# Patient Record
Sex: Female | Born: 1990 | Race: Black or African American | Hispanic: Yes | Marital: Single | Smoking: Former smoker
Health system: Southern US, Community
[De-identification: ages and names within clinical notes are randomized; demographics above are authoritative.]

## PROBLEM LIST (undated history)

## (undated) ENCOUNTER — Inpatient Hospital Stay (HOSPITAL_COMMUNITY): Payer: Self-pay

## (undated) DIAGNOSIS — D573 Sickle-cell trait: Secondary | ICD-10-CM

## (undated) DIAGNOSIS — K219 Gastro-esophageal reflux disease without esophagitis: Secondary | ICD-10-CM

## (undated) DIAGNOSIS — O24419 Gestational diabetes mellitus in pregnancy, unspecified control: Secondary | ICD-10-CM

## (undated) DIAGNOSIS — D649 Anemia, unspecified: Secondary | ICD-10-CM

## (undated) HISTORY — DX: Gestational diabetes mellitus in pregnancy, unspecified control: O24.419

## (undated) HISTORY — PX: NO PAST SURGERIES: SHX2092

---

## 2014-07-22 ENCOUNTER — Observation Stay: Payer: Self-pay | Admitting: Obstetrics and Gynecology

## 2015-02-21 NOTE — H&P (Signed)
L&D Evaluation:  History:  HPI 24 yo [id number] [facility]84[facility]d [facility] presenting with braxton hick contractions.  She reports being in court today for a speeding ticket when she started developing what she thought were braxton hicks contractions and was taken via EMS [facility].  She does have a history of a later preterm delivery with her most recent delivery 11/11/13 at 3356w5d.  Symptoms subsided shortly after arrival.  No LOF, no VB, +FM.  [facility], release of records signed.  [facility] and reports not being on 17-P injections   Presents with contractions   Patient's Medical History No Chronic Illness   Patient's Surgical History none   Medications Pre Natal Vitamins   Allergies latex   Social History none   Family History Non-Contributory   ROS:  ROS All systems were reviewed.  HEENT, CNS, GI, GU, Respiratory, CV, Renal and Musculoskeletal systems were found to be normal.   Exam:  Vital Signs stable   Urine Protein not completed   General no apparent distress   Mental Status clear   Abdomen gravid, non-tender   Estimated Fetal Weight Average for gestational age   Edema no edema   Pelvic no external lesions, cervix closed and thick   Mebranes Intact   FHT normal rate with no decels, 135, mod, +acc, no decels   Ucx absent   Skin dry, no lesions   Impression:  Impression 3584w4d discomforts of pregnancy   Plan:  Comments 1) Braxton Hicks contractions - vs possible discomforts of pregnancy, no contractions noted on tocometer  2) Fetus - category I, reactive tacing  3) Disp0 - 10/20 follow with primary OB/GYN   Electronic Signatures: Lorrene ReidStaebler, Olie Dibert M (MD)  (Signed 09-Oct-15 12:38)  Authored: L&D Evaluation   Last Updated: 09-Oct-15 12:38 by Lorrene ReidStaebler, Kaven Cumbie M (MD)

## 2015-04-30 ENCOUNTER — Emergency Department (INDEPENDENT_AMBULATORY_CARE_PROVIDER_SITE_OTHER)
Admission: EM | Admit: 2015-04-30 | Discharge: 2015-04-30 | Disposition: A | Discharge status: Home / Self Care | Payer: Self-pay | Source: Home / Self Care | Attending: Family Medicine | Admitting: Family Medicine

## 2015-04-30 ENCOUNTER — Encounter (HOSPITAL_COMMUNITY): Payer: Self-pay | Admitting: Emergency Medicine

## 2015-04-30 DIAGNOSIS — J029 Acute pharyngitis, unspecified: Secondary | ICD-10-CM

## 2015-04-30 LAB — POCT RAPID STREP A: Streptococcus, Group A Screen (Direct): NEGATIVE

## 2015-04-30 MED ORDER — AMOXICILLIN 500 MG PO CAPS: 500.0000 mg | ORAL_CAPSULE | Freq: Three times a day (TID) | ORAL | Status: DC

## 2015-04-30 NOTE — ED Provider Notes (Signed)
CSN: [id number]     Arrival date & time 04/30/15  1613 History   First MD Initiated Contact with Patient 04/30/15 1643     Chief Complaint  Patient presents with  . Sore Throat   (Consider location/radiation/quality/duration/timing/severity/associated sxs/prior Treatment) Patient is a 24 y.o. female presenting with pharyngitis. The history is provided by the patient.  Sore Throat This is a new problem. The current episode started 2 days ago. The problem has been gradually worsening. Pertinent negatives include no chest pain and no abdominal pain. The symptoms are aggravated by swallowing.    History reviewed. No pertinent past medical history. History reviewed. No pertinent past surgical history. No family history on file. History  Substance Use Topics  . Smoking status: Current Every Day Smoker  . Smokeless tobacco: Not on file  . Alcohol Use: No   OB History    No data available     Review of Systems  Constitutional: Positive for fever and chills. Negative for activity change and appetite change.  HENT: Positive for sore throat.   Respiratory: Negative.  Negative for cough.   Cardiovascular: Negative for chest pain.  Gastrointestinal: Negative for abdominal pain.    Allergies  Review of patient's allergies indicates no known allergies.  Home Medications   Prior to Admission medications   Medication Sig Start Date End Date Taking? Authorizing Provider  acetaminophen (TYLENOL) 325 MG tablet Take 650 mg by mouth every 6 (six) hours as needed.   Yes Historical Provider, MD  amoxicillin (AMOXIL) 500 MG capsule Take 1 capsule (500 mg total) by mouth 3 (three) times daily. 04/30/15   Linna HoffJames D Youlanda Tomassetti, MD   BP 133/72 mmHg  Pulse 61  Temp(Src) 100.3 F (37.9 C) (Oral)  Resp 18  SpO2 100%  LMP 03/16/2015 Physical Exam  Constitutional: She is oriented to person, place, and time. She appears well-developed and well-nourished.  HENT:  Head: Normocephalic.  Right Ear: External  ear normal.  Left Ear: External ear normal.  Mouth/Throat: Uvula is midline and mucous membranes are normal. Posterior oropharyngeal erythema present. No oropharyngeal exudate or tonsillar abscesses.  Eyes: Conjunctivae are normal. Pupils are equal, round, and reactive to light.  Cardiovascular: Regular rhythm and normal heart sounds.   Pulmonary/Chest: Effort normal and breath sounds normal.  Lymphadenopathy:    She has cervical adenopathy.  Neurological: She is alert and oriented to person, place, and time.  Skin: Skin is warm and dry.  Nursing note and vitals reviewed.   ED Course  Procedures (including critical care time) Labs Review Labs Reviewed  POCT RAPID STREP A   Strep neg. Imaging Review No results found.   MDM   1. Acute pharyngitis, unspecified pharyngitis type        Linna HoffJames D Sequan Auxier, MD 04/30/15 1723

## 2015-04-30 NOTE — ED Notes (Signed)
Reports being seen a month ago and diagnosed with pharyngitis.  Prescribed amoxicillin, but did not get prescription filled.  Symptoms resolved.  Now has sore throat and fever for 2 days

## 2015-04-30 NOTE — Discharge Instructions (Signed)
Drink lots of fluids, take all of medicine, use lozenges as needed.return if needed °

## 2015-05-02 LAB — CULTURE, GROUP A STREP: Strep A Culture: POSITIVE — AB

## 2015-05-02 NOTE — ED Notes (Signed)
Final repot positive for strep. Treatment adequate w Rx provided

## 2015-06-12 ENCOUNTER — Encounter (HOSPITAL_COMMUNITY): Payer: Self-pay | Admitting: *Deleted

## 2015-06-12 ENCOUNTER — Emergency Department (HOSPITAL_COMMUNITY)
Admission: EM | Admit: 2015-06-12 | Discharge: 2015-06-12 | Disposition: A | Discharge status: Home / Self Care | Payer: Medicaid Other | Attending: Emergency Medicine | Admitting: Emergency Medicine

## 2015-06-12 DIAGNOSIS — O209 Hemorrhage in early pregnancy, unspecified: Secondary | ICD-10-CM | POA: Diagnosis present

## 2015-06-12 DIAGNOSIS — Z3A09 9 weeks gestation of pregnancy: Secondary | ICD-10-CM | POA: Insufficient documentation

## 2015-06-12 DIAGNOSIS — Z792 Long term (current) use of antibiotics: Secondary | ICD-10-CM | POA: Diagnosis not present

## 2015-06-12 DIAGNOSIS — F17210 Nicotine dependence, cigarettes, uncomplicated: Secondary | ICD-10-CM | POA: Diagnosis not present

## 2015-06-12 DIAGNOSIS — O99331 Smoking (tobacco) complicating pregnancy, first trimester: Secondary | ICD-10-CM | POA: Insufficient documentation

## 2015-06-12 DIAGNOSIS — O034 Incomplete spontaneous abortion without complication: Secondary | ICD-10-CM | POA: Diagnosis not present

## 2015-06-12 LAB — BASIC METABOLIC PANEL
Anion gap: 4 — ABNORMAL LOW (ref 5–15)
BUN: 7 mg/dL (ref 6–20)
CO2: 22 mmol/L (ref 22–32)
Calcium: 8.9 mg/dL (ref 8.9–10.3)
Chloride: 107 mmol/L (ref 101–111)
Creatinine, Ser: 0.59 mg/dL (ref 0.44–1.00)
GFR calc Af Amer: >60 mL/min (ref >60)
GFR calc non Af Amer: >60 mL/min (ref >60)
GLUCOSE: 88 mg/dL (ref 65–99)
POTASSIUM: 3.7 mmol/L (ref 3.5–5.1)
Sodium: 133 mmol/L — ABNORMAL LOW (ref 135–145)

## 2015-06-12 LAB — CBC WITH DIFFERENTIAL/PLATELET
Basophils Absolute: 0 10*3/uL (ref 0.0–0.1)
Basophils Relative: 0 % (ref 0–1)
EOS PCT: 1 % (ref 0–5)
Eosinophils Absolute: 0.1 10*3/uL (ref 0.0–0.7)
HCT: 33.1 % — ABNORMAL LOW (ref 36.0–46.0)
Hemoglobin: 11.1 g/dL — ABNORMAL LOW (ref 12.0–15.0)
LYMPHS ABS: 3.5 10*3/uL (ref 0.7–4.0)
Lymphocytes Relative: 36 % (ref 12–46)
MCH: 26.9 pg (ref 26.0–34.0)
MCHC: 33.5 g/dL (ref 30.0–36.0)
MCV: 80.1 fL (ref 78.0–100.0)
Monocytes Absolute: 1.3 10*3/uL — ABNORMAL HIGH (ref 0.1–1.0)
Monocytes Relative: 13 % — ABNORMAL HIGH (ref 3–12)
Neutro Abs: 4.9 10*3/uL (ref 1.7–7.7)
Neutrophils Relative %: 50 % (ref 43–77)
PLATELETS: 265 10*3/uL (ref 150–400)
RBC: 4.13 MIL/uL (ref 3.87–5.11)
RDW: 16.9 % — ABNORMAL HIGH (ref 11.5–15.5)
WBC: 9.8 10*3/uL (ref 4.0–10.5)

## 2015-06-12 LAB — WET PREP, GENITAL
Trich, Wet Prep: NONE SEEN
YEAST WET PREP: NONE SEEN

## 2015-06-12 LAB — GC/CHLAMYDIA PROBE AMP (~~LOC~~) NOT AT ARMC
Chlamydia: NEGATIVE
Neisseria Gonorrhea: NEGATIVE

## 2015-06-12 LAB — HCG, QUANTITATIVE, PREGNANCY: hCG, Beta Chain, Quant, S: 81378 m[IU]/mL — ABNORMAL HIGH (ref <5)

## 2015-06-12 MED ORDER — ONDANSETRON 4 MG PO TBDP
4.0000 mg | ORAL_TABLET | Freq: Once | ORAL | Status: AC
  Administered 2015-06-12: 4 mg via ORAL
  Filled 2015-06-12: qty 1

## 2015-06-12 NOTE — ED Notes (Signed)
Pt comes from home with c/o vaginal bleeding, sts woke up in a puddle of blood, pt reports she is [redacted] weeks pregnant.

## 2015-06-12 NOTE — Discharge Instructions (Signed)
Incomplete Miscarriage Patty Espinoza, your exam shows an open cervix and likely miscarriage.  You did not want to stay for a formal ultrasound so you can go to your 8am appointment.  Please attend this appointment and see an OB/GYN within 3 days.  If any symptoms worsen, come back to the ED immediately.  Thank you. A miscarriage is the sudden loss of an unborn baby (fetus) before the 20th week of pregnancy. In an incomplete miscarriage, parts of the fetus or placenta (afterbirth) remain in the body.  Having a miscarriage can be an emotional experience. Talk with your health care provider about any questions you may have about miscarrying, the grieving process, and your future pregnancy plans. CAUSES   Problems with the fetal chromosomes that make it impossible for the baby to develop normally. Problems with the baby's genes or chromosomes are most often the result of errors that occur by chance as the embryo divides and grows. The problems are not inherited from the parents.  Infection of the cervix or uterus.  Hormone problems.  Problems with the cervix, such as having an incompetent cervix. This is when the tissue in the cervix is not strong enough to hold the pregnancy.  Problems with the uterus, such as an abnormally shaped uterus, uterine fibroids, or congenital abnormalities.  Certain medical conditions.  Smoking, drinking alcohol, or taking illegal drugs.  Trauma. SYMPTOMS   Vaginal bleeding or spotting, with or without cramps or pain.  Pain or cramping in the abdomen or lower back.  Passing fluid, tissue, or blood clots from the vagina. DIAGNOSIS  Your health care provider will perform a physical exam. You may also have an ultrasound to confirm the miscarriage. Blood or urine tests may also be ordered. TREATMENT   Usually, a dilation and curettage (D&C) procedure is performed. During a D&C procedure, the cervix is widened (dilated) and any remaining fetal or placental tissue is gently  removed from the uterus.  Antibiotic medicines are prescribed if there is an infection. Other medicines may be given to reduce the size of the uterus (contract) if there is a lot of bleeding.  If you have Rh negative blood and your baby was Rh positive, you will need a Rho (D) immune globulin shot. This shot will protect any future baby from having Rh blood problems in future pregnancies.  You may be confined to bed rest. This means you should stay in bed and only get up to use the bathroom. HOME CARE INSTRUCTIONS   Rest as directed by your health care provider.  Restrict activity as directed by your health care provider. You may be allowed to continue light activity if curettage was not done but you require further treatment.  Keep track of the number of pads you use each day. Keep track of how soaked (saturated) they are. Record this information.  Do not  use tampons.  Do not douche or have sexual intercourse until approved by your health care provider.  Keep all follow-up appointments for reevaluation and continuing management.  Only take over-the-counter or prescription medicines for pain, fever, or discomfort as directed by your health care provider.  Take antibiotic medicine as directed by your health care provider. Make sure you finish it even if you start to feel better. SEEK IMMEDIATE MEDICAL CARE IF:   You experience severe cramps in your stomach, back, or abdomen.  You have an unexplained temperature (make sure to record these temperatures).  You pass large clots or tissue (save these for  your health care provider to inspect).  Your bleeding increases.  You become light-headed, weak, or have fainting episodes. MAKE SURE YOU:   Understand these instructions.  Will watch your condition.  Will get help right away if you are not doing well or get worse. Document Released: 09/30/2005 Document Revised: 02/14/2014 Document Reviewed: 04/29/2013 [facility] Patient  Information 2015 [place], [place]. This information is not intended to replace advice given to you by your health care provider. Make sure you discuss any questions you have with your health care provider.

## 2015-06-12 NOTE — ED Provider Notes (Signed)
CSN: [id number]     Arrival date & time 06/12/15  0517 History   First MD Initiated Contact with Patient 06/12/15 0539     Chief Complaint  Patient presents with  . Vaginal Bleeding     (Consider location/radiation/quality/duration/timing/severity/associated sxs/prior Treatment) HPI   Ms. Patty Espinoza is a 24yo female who is currently [redacted] weeks pregnant presenting today for vaginal bleeding.  She states she had bleeding a few weeks ago, went to [facility] and was diagnosed with threatened abortion.  Since then she has had lower abdominal pain.  She woke up this morning in a pool of blood.  She states it seemed like a lot.  She denies seeing any blood clots or fetal tissue.  She has nausea but no vomiting.  She has had normal BM and no change in urination.  She denies fevers.  She has no further complaints.  10 Systems reviewed and are negative for acute change except as noted in the HPI.    History reviewed. No pertinent past medical history. History reviewed. No pertinent past surgical history. No family history on file. Social History  Substance Use Topics  . Smoking status: Current Every Day Smoker  . Smokeless tobacco: None  . Alcohol Use: No   OB History    No data available     Review of Systems    Allergies  Review of patient's allergies indicates no known allergies.  Home Medications   Prior to Admission medications   Medication Sig Start Date End Date Taking? Authorizing Provider  acetaminophen (TYLENOL) 325 MG tablet Take 650 mg by mouth every 6 (six) hours as needed.    Historical Provider, MD  amoxicillin (AMOXIL) 500 MG capsule Take 1 capsule (500 mg total) by mouth 3 (three) times daily. 04/30/15   Linna Hoff, MD   BP 120/73 mmHg  Pulse 60  Temp(Src) 98.4 F (36.9 C) (Oral)  Resp 19  Ht 5\' 2"  (1.575 m)  Wt 125 lb 9.6 oz (56.972 kg)  BMI 22.97 kg/m2  SpO2 98%  LMP 04/09/2015 Physical Exam  Constitutional: She is oriented to person, place, and time. She  appears well-developed and well-nourished. No distress.  HENT:  Head: Normocephalic and atraumatic.  Nose: Nose normal.  Mouth/Throat: Oropharynx is clear and moist. No oropharyngeal exudate.  Eyes: Conjunctivae and EOM are normal. Pupils are equal, round, and reactive to light. No scleral icterus.  Neck: Normal range of motion. Neck supple. No JVD present. No tracheal deviation present. No thyromegaly present.  Cardiovascular: Normal rate, regular rhythm and normal heart sounds.  Exam reveals no gallop and no friction rub.   No murmur heard. Pulmonary/Chest: Effort normal and breath sounds normal. No respiratory distress. She has no wheezes. She exhibits no tenderness.  Abdominal: Soft. Bowel sounds are normal. She exhibits no distension and no mass. There is no tenderness. There is no rebound and no guarding.  Genitourinary: No vaginal discharge found.  Cervical os is open.  No CMT or adnexal tenderness. Light brown discharge seen coming from cervical os, very trace amounts of blood associated.  Musculoskeletal: Normal range of motion. She exhibits no edema or tenderness.  Lymphadenopathy:    She has no cervical adenopathy.  Neurological: She is alert and oriented to person, place, and time. No cranial nerve deficit. She exhibits normal muscle tone.  Skin: Skin is warm and dry. No rash noted. No erythema. No pallor.  Nursing note and vitals reviewed.   ED Course  Procedures (including critical  care time) Labs Review Labs Reviewed  WET PREP, GENITAL - Abnormal; Notable for the following:    Clue Cells Wet Prep HPF POC FEW (*)    WBC, Wet Prep HPF POC MANY (*)    All other components within normal limits  CBC WITH DIFFERENTIAL/PLATELET - Abnormal; Notable for the following:    Hemoglobin 11.1 (*)    HCT 33.1 (*)    RDW 16.9 (*)    Monocytes Relative 13 (*)    Monocytes Absolute 1.3 (*)    All other components within normal limits  BASIC METABOLIC PANEL  HCG, QUANTITATIVE,  PREGNANCY  URINALYSIS, ROUTINE W REFLEX MICROSCOPIC (NOT AT [facility])  GC/CHLAMYDIA PROBE AMP ([place]) NOT AT [facility]    Imaging Review No results found. I have personally reviewed and evaluated these images and lab results as part of my medical decision-making.   EKG Interpretation None      MDM   Final diagnoses:  None    Patient presents to the ED for vaginal bleeding in pregnancy with history of threatened AB.  I have concern for incomplete AB as my physical exam reveals an open os.  Will obtain ultrasound for further evaluation.  Patient given zofran for nausea control.    Patient does not want to stay for ultrasound because she has an appointment for an abortion at 8am.  I believe she is having an incomplete abortion currently and this was explained to her. I would like to obtain the ultrasound to confirm this, but the patient would rather leave.  She possess decision making capacity.    She will report immediately to the abortion center.  OB.GYN follow up was advised as well.  She appears well and in NAD.  Her VS remain within her normal limits and she is safe for DC   Tomasita Crumble, MD 06/12/15 [phone]

## 2015-10-15 NOTE — L&D Delivery Note (Signed)
Delivery Note  Hospitalized since pprom 10/29. This morning diagnosed with triple I. Amp/gent started, mg started for neuroprotection, and pitocin for augmentation. Recurrent variables with moderate variability, pitocin stopped after prolonged decel. Amnioinfusion given. Rapid active phase. Delayed cord clamping for approximately 20 s at which time nursing baby team requested transfer to warmer.  At 3:33 PM a viable female was delivered via Vaginal, Spontaneous Delivery (Presentation: oa  ).  APGAR: 4, 7; weight 3 lb 3.5 oz (1460 g).   Placenta status: intact .  Cord: 3 vessel. with the following complications: none.  Cord pH: not obtained  Anesthesia:  none Episiotomy: None Lacerations:  none Est. Blood Loss (mL): 100  Mom to postpartum.  Baby to NICU.  Patty Espinoza 08/20/2016, 4:05 PM

## 2016-03-23 ENCOUNTER — Encounter (HOSPITAL_COMMUNITY): Payer: Self-pay | Admitting: Emergency Medicine

## 2016-03-23 ENCOUNTER — Emergency Department (HOSPITAL_COMMUNITY)
Admission: EM | Admit: 2016-03-23 | Discharge: 2016-03-24 | Disposition: A | Discharge status: Home / Self Care | Payer: Medicaid Other | Attending: Emergency Medicine | Admitting: Emergency Medicine

## 2016-03-23 DIAGNOSIS — R1013 Epigastric pain: Secondary | ICD-10-CM | POA: Insufficient documentation

## 2016-03-23 DIAGNOSIS — O210 Mild hyperemesis gravidarum: Secondary | ICD-10-CM | POA: Insufficient documentation

## 2016-03-23 DIAGNOSIS — O219 Vomiting of pregnancy, unspecified: Secondary | ICD-10-CM

## 2016-03-23 DIAGNOSIS — Z79899 Other long term (current) drug therapy: Secondary | ICD-10-CM | POA: Insufficient documentation

## 2016-03-23 DIAGNOSIS — F17200 Nicotine dependence, unspecified, uncomplicated: Secondary | ICD-10-CM | POA: Insufficient documentation

## 2016-03-23 DIAGNOSIS — Z3A01 Less than 8 weeks gestation of pregnancy: Secondary | ICD-10-CM | POA: Insufficient documentation

## 2016-03-23 LAB — CBC
HCT: 31.3 % — ABNORMAL LOW (ref 36.0–46.0)
Hemoglobin: 10.9 g/dL — ABNORMAL LOW (ref 12.0–15.0)
MCH: 27.9 pg (ref 26.0–34.0)
MCHC: 34.8 g/dL (ref 30.0–36.0)
MCV: 80.3 fL (ref 78.0–100.0)
PLATELETS: 248 10*3/uL (ref 150–400)
RBC: 3.9 MIL/uL (ref 3.87–5.11)
RDW: 16.2 % — ABNORMAL HIGH (ref 11.5–15.5)
WBC: 12.8 10*3/uL — AB (ref 4.0–10.5)

## 2016-03-23 LAB — I-STAT BETA HCG BLOOD, ED (MC, WL, AP ONLY)

## 2016-03-23 NOTE — ED Notes (Addendum)
Pt found out she is pregnant about a month ago. She is unsure how far along she is. Pt states that today she has had increased emesis (about 3 times). Pt denies abdominal pain or diarrhea. Pt denies urinary symptoms.  Pt stated that the last time she experienced emesis, she felt like she couldn't breathe. Pt denies any feelings of SOB currently

## 2016-03-24 LAB — COMPREHENSIVE METABOLIC PANEL
ALK PHOS: 44 U/L (ref 38–126)
ALT: 13 U/L — AB (ref 14–54)
AST: 14 U/L — AB (ref 15–41)
Albumin: 4.1 g/dL (ref 3.5–5.0)
Anion gap: 6 (ref 5–15)
BILIRUBIN TOTAL: 0.6 mg/dL (ref 0.3–1.2)
CHLORIDE: 106 mmol/L (ref 101–111)
CO2: 25 mmol/L (ref 22–32)
Calcium: 9.2 mg/dL (ref 8.9–10.3)
Creatinine, Ser: 0.61 mg/dL (ref 0.44–1.00)
GFR calc Af Amer: >60 mL/min (ref >60)
Glucose, Bld: 100 mg/dL — ABNORMAL HIGH (ref 65–99)
Potassium: 3.9 mmol/L (ref 3.5–5.1)
Sodium: 137 mmol/L (ref 135–145)
TOTAL PROTEIN: 7.6 g/dL (ref 6.5–8.1)

## 2016-03-24 LAB — URINALYSIS, ROUTINE W REFLEX MICROSCOPIC
Bilirubin Urine: NEGATIVE
Glucose, UA: NEGATIVE mg/dL
HGB URINE DIPSTICK: NEGATIVE
Ketones, ur: NEGATIVE mg/dL
Leukocytes, UA: NEGATIVE
Nitrite: NEGATIVE
Protein, ur: NEGATIVE mg/dL
SPECIFIC GRAVITY, URINE: 1.009 (ref 1.005–1.030)
pH: 7 (ref 5.0–8.0)

## 2016-03-24 LAB — LIPASE, BLOOD: LIPASE: 23 U/L (ref 11–51)

## 2016-03-24 MED ORDER — DIPHENHYDRAMINE HCL 50 MG/ML IJ SOLN
12.5000 mg | Freq: Once | INTRAMUSCULAR | Status: AC
  Administered 2016-03-24: 12.5 mg via INTRAVENOUS
  Filled 2016-03-24: qty 1

## 2016-03-24 MED ORDER — METOCLOPRAMIDE HCL 10 MG PO TABS: 10.0000 mg | ORAL_TABLET | Freq: Three times a day (TID) | ORAL | Status: DC | PRN

## 2016-03-24 MED ORDER — SODIUM CHLORIDE 0.9 % IV BOLUS (SEPSIS)
1000.0000 mL | Freq: Once | INTRAVENOUS | Status: AC
  Administered 2016-03-24: 1000 mL via INTRAVENOUS

## 2016-03-24 MED ORDER — METOCLOPRAMIDE HCL 5 MG/ML IJ SOLN
10.0000 mg | Freq: Once | INTRAMUSCULAR | Status: AC
  Administered 2016-03-24: 10 mg via INTRAVENOUS
  Filled 2016-03-24: qty 2

## 2016-03-24 MED ORDER — DIPHENHYDRAMINE HCL 50 MG/ML IJ SOLN: 25.0000 mg | Freq: Once | INTRAMUSCULAR | Status: DC

## 2016-03-24 NOTE — ED Notes (Signed)
Patient given water to sip on. Patient verbalized understanding. Patient requested to go to the restroom. Patient walked to the restroom without a problem.

## 2016-03-24 NOTE — Discharge Instructions (Signed)
Please read and follow all provided instructions.  Your diagnoses today include:  1. Vomiting during pregnancy     Tests performed today include:  Blood counts and electrolytes  Urine test  Pregnancy test-positive  Vital signs. See below for your results today.   Medications prescribed:   Reglan - medication for nausea and vomiting  Take any prescribed medications only as directed.  Home care instructions:  Follow any educational materials contained in this packet.  Follow-up instructions: Please follow-up with your primary care provider in the next 7 days for further evaluation of your symptoms. You may call the women's hospital outpatient clinics for routine prenatal care.   Return instructions:   Please return to the Emergency Department or [facility] if you experience worsening symptoms.  Return with abdominal pain, fever, persistent vomiting.  Return with abdominal pain, vaginal bleeding or discharge.   Please return if you have any other emergent concerns.  Additional Information:  Your vital signs today were: BP 144/77 mmHg   Pulse 95   Temp(Src) 98.5 F (36.9 C) (Oral)   Resp 16   Ht 5\' 2"  (1.575 m)   Wt 55.792 kg   BMI 22.49 kg/m2   SpO2 100% If your blood pressure (BP) was elevated above 135/85 this visit, please have this repeated by your doctor within one month. --------------

## 2016-03-24 NOTE — ED Provider Notes (Signed)
CSN: [id number]     Arrival date & time 03/23/16  2209 History   First MD Initiated Contact with Patient 03/24/16 [phone]     Chief Complaint  Patient presents with  . Emesis During Pregnancy  . Sore Throat     (Consider location/radiation/quality/duration/timing/severity/associated sxs/prior Treatment) HPI Comments: Patient is currently pregnant, last menstrual period, March 2017 -- presents with 3 days of nausea, vomiting. She has an associated sore throat which he thinks is from the vomiting. Patient has had similar vomiting with previous pregnancies. No treatments prior to arrival. She has some mild epigastric pain. No fever, URI symptoms, chest pain. She does have shortness of breath while vomiting, otherwise no shortness of breath. No urinary symptoms. States that she has any been able to tolerate a few crackers. Onset of symptoms acute. Course is constant. Nothing makes symptoms better or worse.  The history is provided by the patient.    History reviewed. No pertinent past medical history. History reviewed. No pertinent past surgical history. No family history on file. Social History  Substance Use Topics  . Smoking status: Current Every Day Smoker  . Smokeless tobacco: None  . Alcohol Use: No   OB History    No data available     Review of Systems  Constitutional: Negative for fever.  HENT: Negative for rhinorrhea and sore throat.   Eyes: Negative for redness.  Respiratory: Positive for shortness of breath (Only while vomiting). Negative for cough.   Cardiovascular: Negative for chest pain.  Gastrointestinal: Positive for nausea, vomiting and abdominal pain. Negative for diarrhea.  Genitourinary: Negative for dysuria.  Musculoskeletal: Negative for myalgias.  Skin: Negative for rash.  Neurological: Negative for headaches.      Allergies  Review of patient's allergies indicates no known allergies.  Home Medications   Prior to Admission medications   Medication  Sig Start Date End Date Taking? Authorizing Provider  acetaminophen (TYLENOL) 325 MG tablet Take 650 mg by mouth every 6 (six) hours as needed.    Historical Provider, MD  amoxicillin (AMOXIL) 500 MG capsule Take 1 capsule (500 mg total) by mouth 3 (three) times daily. Patient not taking: Reported on 06/12/2015 04/30/15   Linna Hoff, MD   BP 121/82 mmHg  Pulse 62  Temp(Src) 98.7 F (37.1 C) (Oral)  Resp 16  Ht  (1.575 m)  Wt 55.792 kg  BMI 22.49 kg/m2  SpO2 100%   Physical Exam  Constitutional: She appears well-developed and well-nourished.  HENT:  Head: Normocephalic and atraumatic.  Mouth/Throat: Oropharynx is clear and moist.  Eyes: Conjunctivae are normal. Right eye exhibits no discharge. Left eye exhibits no discharge.  Neck: Normal range of motion. Neck supple.  Cardiovascular: Normal rate, regular rhythm and normal heart sounds.   No murmur heard. Pulmonary/Chest: Effort normal and breath sounds normal. No respiratory distress. She has no wheezes. She has no rales.  Abdominal: Soft. There is tenderness (epigastric pain only). There is no rebound and no guarding.  Neurological: She is alert.  Skin: Skin is warm and dry.  Psychiatric: She has a normal mood and affect.  Nursing note and vitals reviewed.   ED Course  Procedures (including critical care time) Labs Review Labs Reviewed  COMPREHENSIVE METABOLIC PANEL - Abnormal; Notable for the following:    Glucose, Bld 100 (*)    BUN <5 (*) [id number] (*)    ALT 13 (*)    All other components within normal limits  CBC -  Abnormal; Notable for the following:    WBC 12.8 (*)    Hemoglobin 10.9 (*)    HCT 31.3 (*)    RDW 16.2 (*)    All other components within normal limits  I-STAT BETA HCG BLOOD, ED ([facility], [facility], [facility] ONLY) - Abnormal; Notable for the following:    I-stat hCG, quantitative >2000.0 (*)    All other components within normal limits  LIPASE, BLOOD  URINALYSIS, ROUTINE W REFLEX MICROSCOPIC (NOT AT [facility])     3:16 AM Patient seen and examined. Work-up initiated. Medications ordered.   Vital signs reviewed and are as follows: BP 121/82 mmHg  Pulse 62  Temp(Src) 98.7 F (37.1 C) (Oral)  Resp 16  Ht 5\' 2"  (1.575 m)  Wt 55.792 kg  BMI 22.49 kg/m2  SpO2 100%  5:49 AM patient has done well. She is now drinking water in room without vomiting. Patient feels comfortable with discharge to home at this time. She has done well with Reglan. [facility]. Encouraged to return here or go to the MAU with uncontrolled symptoms. Patient verbalizes understanding and agrees with plan.  MDM   Final diagnoses:  Vomiting during pregnancy   Patient with vomiting in pregnancy. No significant abdominal pain. No vaginal bleeding or symptoms. No indication for imaging at this point. Labs are reassuring. Patient appears well-hydrated. Now tolerating oral fluids. Will discharge to home with Reglan. Appropriate referrals given.   Renne CriglerJoshua Clotiel Troop, PA-C 03/24/16 [id number]  Tomasita CrumbleAdeleke Oni, MD 03/24/16 [phone]

## 2016-03-30 ENCOUNTER — Encounter (HOSPITAL_COMMUNITY): Payer: Self-pay | Admitting: Family Medicine

## 2016-03-30 ENCOUNTER — Emergency Department (HOSPITAL_COMMUNITY)
Admission: EM | Admit: 2016-03-30 | Discharge: 2016-03-30 | Disposition: A | Discharge status: Home / Self Care | Payer: Medicaid Other | Attending: Emergency Medicine | Admitting: Emergency Medicine

## 2016-03-30 DIAGNOSIS — Z3A13 13 weeks gestation of pregnancy: Secondary | ICD-10-CM | POA: Insufficient documentation

## 2016-03-30 DIAGNOSIS — F17200 Nicotine dependence, unspecified, uncomplicated: Secondary | ICD-10-CM | POA: Insufficient documentation

## 2016-03-30 DIAGNOSIS — O26891 Other specified pregnancy related conditions, first trimester: Secondary | ICD-10-CM | POA: Insufficient documentation

## 2016-03-30 DIAGNOSIS — Z91040 Latex allergy status: Secondary | ICD-10-CM | POA: Insufficient documentation

## 2016-03-30 DIAGNOSIS — M549 Dorsalgia, unspecified: Secondary | ICD-10-CM

## 2016-03-30 DIAGNOSIS — M546 Pain in thoracic spine: Secondary | ICD-10-CM | POA: Insufficient documentation

## 2016-03-30 NOTE — ED Notes (Signed)
Pt here for mid back pain that started all of a sudden while she was braiding her sons hair. Denies injury. sts she was sitting when it happened.

## 2016-03-30 NOTE — ED Provider Notes (Signed)
CSN: [id number]     Arrival date & time 03/30/16  1402 History   By signing my name below, I, Patty Espinoza, attest that this documentation has been prepared under the direction and in the presence of non-physician practitioner, Eyvonne MechanicJeffrey Winnell Bento, PA-C, . Electronically Signed: Marisue HumbleMichelle Espinoza, Scribe. 03/30/2016. 3:33 PM.    Chief Complaint  Patient presents with  . Back Pain   The history is provided by the patient. No language interpreter was used.   HPI Comments:  Patty Kustermani Julian ReilFranco Bell is a 25 y.o. 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 Pt has had difficulty walking, straightening her back and laying down secondary to pain. Her boyfriend tried to massage the area without relief. Pt is currently [redacted] weeks pregnant. Denies numbness, tinging or weakness in arms or legs, abdominal pain, vaginal bleeding, or vaginal discharge.   History reviewed. No pertinent past medical history. History reviewed. No pertinent past surgical history. History reviewed. No pertinent family history. Social History  Substance Use Topics  . Smoking status: Current Every Day Smoker  . Smokeless tobacco: None  . Alcohol Use: No   OB History    Gravida Para Term Preterm AB TAB SAB Ectopic Multiple Living   1              Review of Systems  Gastrointestinal: Negative for abdominal pain.  Genitourinary: Negative for vaginal bleeding and vaginal discharge.  Musculoskeletal: Positive for back pain.  Neurological: Negative for weakness and numbness.    Allergies  Latex  Home Medications   Prior to Admission medications   Medication Sig Start Date End Date Taking? Authorizing Provider  flintstones complete (FLINTSTONES) 60 MG chewable tablet Chew 1 tablet by mouth daily.   Yes Historical Provider, MD  metoCLOPramide (REGLAN) 10 MG tablet Take 1 tablet (10 mg total) by mouth every 8 (eight) hours as needed  for nausea or vomiting. 03/24/16  Yes Renne CriglerJoshua Geiple, PA-C   BP 115/67 mmHg  Pulse 72  Temp(Src) 97.6 F (36.4 C) (Oral)  Resp 18  SpO2 100%  LMP 04/09/2015   Physical Exam  Constitutional: She is oriented to person, place, and time. She appears well-developed and well-nourished.  HENT:  Head: Normocephalic.  Eyes: EOM are normal.  Neck: Normal range of motion.  Pulmonary/Chest: Effort normal.  Abdominal: She exhibits no distension.  Musculoskeletal:  TTP of the thoracic paravertebral musculature   Neurological: She is alert and oriented to person, place, and time.  Psychiatric: She has a normal mood and affect.  Nursing note and vitals reviewed.   ED Course  Procedures  DIAGNOSTIC STUDIES:  Oxygen Saturation is 100% on RA, normal by my interpretation.    COORDINATION OF CARE:  3:32 PM Recommended heat therapy. Discussed treatment plan with pt at bedside and pt agreed to plan.  Labs Review Labs Reviewed - No data to display  Imaging Review No results found. I have personally reviewed and evaluated these images and lab results as part of my medical decision-making.   EKG Interpretation None      MDM   Final diagnoses:  Bilateral back pain, unspecified location   Labs:  Imaging:  Consults:  Therapeutics:  Discharge Meds:   Assessment/Plan:25 year old female presents today with back pain. Patient likely having muscular spasm, she is leaning forward in the exam chair. An internist to palpation of the musculature, she has no abdominal pain, vaginal bleeding, chest  pain or shortness of breath. She has no neurological deficits, no red flags for back pain. Due to patient being [redacted] weeks pregnant she was given heat packs here in the ED, symptoms dramatically improved with CPAP, reports symptoms still somewhat present. Patient continued to deny any complications of the pregnancy or neurological deficits. She was instructed to continue using heat, massage, rest as  needed. Strict return precautions given. She verbalized understanding and agreement today's plan.  I personally performed the services described in this documentation, which was scribed in my presence. The recorded information has been reviewed and is accurate.   Eyvonne Mechanic, PA-C 03/30/16 1622  Patty Monday, MD 03/31/16 [phone]

## 2016-03-30 NOTE — Discharge Instructions (Signed)

## 2016-03-30 NOTE — ED Notes (Signed)
Declined W/C at D/C and was escorted to lobby by RN. 

## 2016-06-09 ENCOUNTER — Emergency Department (HOSPITAL_COMMUNITY): Payer: Medicaid Other

## 2016-06-09 ENCOUNTER — Emergency Department (HOSPITAL_COMMUNITY)
Admission: EM | Admit: 2016-06-09 | Discharge: 2016-06-09 | Disposition: A | Discharge status: Other Acute Inpatient Hospital | Payer: Medicaid Other | Attending: Emergency Medicine | Admitting: Emergency Medicine

## 2016-06-09 ENCOUNTER — Encounter (HOSPITAL_COMMUNITY): Payer: Self-pay | Admitting: Nurse Practitioner

## 2016-06-09 DIAGNOSIS — Z3A20 20 weeks gestation of pregnancy: Secondary | ICD-10-CM | POA: Insufficient documentation

## 2016-06-09 DIAGNOSIS — O4700 False labor before 37 completed weeks of gestation, unspecified trimester: Secondary | ICD-10-CM

## 2016-06-09 DIAGNOSIS — Z79899 Other long term (current) drug therapy: Secondary | ICD-10-CM | POA: Diagnosis not present

## 2016-06-09 DIAGNOSIS — O4702 False labor before 37 completed weeks of gestation, second trimester: Secondary | ICD-10-CM

## 2016-06-09 DIAGNOSIS — O479 False labor, unspecified: Secondary | ICD-10-CM

## 2016-06-09 DIAGNOSIS — Z87891 Personal history of nicotine dependence: Secondary | ICD-10-CM | POA: Diagnosis not present

## 2016-06-09 DIAGNOSIS — Z8751 Personal history of pre-term labor: Secondary | ICD-10-CM

## 2016-06-09 DIAGNOSIS — Z3A25 25 weeks gestation of pregnancy: Secondary | ICD-10-CM

## 2016-06-09 DIAGNOSIS — O26892 Other specified pregnancy related conditions, second trimester: Secondary | ICD-10-CM | POA: Diagnosis present

## 2016-06-09 LAB — CBC WITH DIFFERENTIAL/PLATELET
BASOS ABS: 0 10*3/uL (ref 0.0–0.1)
BASOS PCT: 0 %
Eosinophils Absolute: 0.1 10*3/uL (ref 0.0–0.7)
Eosinophils Relative: 1 %
HEMATOCRIT: 28.3 % — AB (ref 36.0–46.0)
HEMOGLOBIN: 10.2 g/dL — AB (ref 12.0–15.0)
LYMPHS PCT: 35 %
Lymphs Abs: 3.8 10*3/uL (ref 0.7–4.0)
MCH: 29.8 pg (ref 26.0–34.0)
MCHC: 36 g/dL (ref 30.0–36.0)
MCV: 82.7 fL (ref 78.0–100.0)
MONO ABS: 1.6 10*3/uL — AB (ref 0.1–1.0)
MONOS PCT: 14 %
NEUTROS ABS: 5.5 10*3/uL (ref 1.7–7.7)
NEUTROS PCT: 50 %
Platelets: 226 10*3/uL (ref 150–400)
RBC: 3.42 MIL/uL — ABNORMAL LOW (ref 3.87–5.11)
RDW: 14.8 % (ref 11.5–15.5)
WBC: 11.1 10*3/uL — ABNORMAL HIGH (ref 4.0–10.5)

## 2016-06-09 LAB — COMPREHENSIVE METABOLIC PANEL
ALT: 8 U/L — ABNORMAL LOW (ref 14–54)
AST: 15 U/L (ref 15–41)
Albumin: 3.4 g/dL — ABNORMAL LOW (ref 3.5–5.0)
Alkaline Phosphatase: 48 U/L (ref 38–126)
Anion gap: 5 (ref 5–15)
BUN: 5 mg/dL — ABNORMAL LOW (ref 6–20)
CO2: 21 mmol/L — ABNORMAL LOW (ref 22–32)
Calcium: 8.8 mg/dL — ABNORMAL LOW (ref 8.9–10.3)
Chloride: 108 mmol/L (ref 101–111)
Creatinine, Ser: 0.57 mg/dL (ref 0.44–1.00)
GFR calc Af Amer: >60 mL/min (ref >60)
GFR calc non Af Amer: >60 mL/min (ref >60)
Glucose, Bld: 87 mg/dL (ref 65–99)
Potassium: 3.7 mmol/L (ref 3.5–5.1)
Sodium: 134 mmol/L — ABNORMAL LOW (ref 135–145)
Total Bilirubin: 0.4 mg/dL (ref 0.3–1.2)
Total Protein: 6.9 g/dL (ref 6.5–8.1)

## 2016-06-09 LAB — URINALYSIS, ROUTINE W REFLEX MICROSCOPIC
Bilirubin Urine: NEGATIVE
Glucose, UA: NEGATIVE mg/dL
Hgb urine dipstick: NEGATIVE
Ketones, ur: NEGATIVE mg/dL
Leukocytes, UA: NEGATIVE
Nitrite: NEGATIVE
Protein, ur: NEGATIVE mg/dL
Specific Gravity, Urine: 1.012 (ref 1.005–1.030)
pH: 6 (ref 5.0–8.0)

## 2016-06-09 LAB — WET PREP, GENITAL
SPERM: NONE SEEN
TRICH WET PREP: NONE SEEN
Yeast Wet Prep HPF POC: NONE SEEN

## 2016-06-09 LAB — FETAL FIBRONECTIN: Fetal Fibronectin: POSITIVE — AB

## 2016-06-09 MED ORDER — BETAMETHASONE SOD PHOS & ACET 6 (3-3) MG/ML IJ SUSP
12.0000 mg | Freq: Once | INTRAMUSCULAR | Status: AC
  Administered 2016-06-09: 6 mg via INTRAMUSCULAR
  Filled 2016-06-09: qty 2

## 2016-06-09 MED ORDER — METRONIDAZOLE 500 MG PO TABS: 500.0000 mg | ORAL_TABLET | Freq: Two times a day (BID) | ORAL | 0 refills | Status: DC

## 2016-06-09 MED ORDER — NIFEDIPINE 10 MG PO CAPS
10.0000 mg | ORAL_CAPSULE | Freq: Once | ORAL | Status: AC
  Administered 2016-06-09: 10 mg via ORAL
  Filled 2016-06-09: qty 1

## 2016-06-09 MED ORDER — NIFEDIPINE 10 MG PO CAPS
10.0000 mg | ORAL_CAPSULE | ORAL | Status: DC | PRN
  Administered 2016-06-09: 10 mg via ORAL
  Filled 2016-06-09: qty 1

## 2016-06-09 MED ORDER — SODIUM CHLORIDE 0.9 % IV BOLUS (SEPSIS)
1000.0000 mL | Freq: Once | INTRAVENOUS | Status: AC
  Administered 2016-06-09: 1000 mL via INTRAVENOUS

## 2016-06-09 NOTE — ED Notes (Signed)
Late entry; report given to [facility]ial[facility]ennifer,[facility]

## 2016-06-09 NOTE — Progress Notes (Signed)
Written and verbal d/c instructions given and understanding vocied 

## 2016-06-09 NOTE — MAU Note (Signed)
[facility]. Alert and oriented. EFM reapplied

## 2016-06-09 NOTE — ED Triage Notes (Signed)
Pt states she is about 5 months pregnant. Started having intermittent contraction like diffuse abdominal pain yesterday/Friday. She endorses hx of premature delivery (29 weeks) and spontaneous abortion(1st trimester). I have paged [facility] who have called back, recommended a doppler fetal check, they are en route to do a complete fetal and mother monitoring.

## 2016-06-09 NOTE — MAU Provider Note (Signed)
Chief Complaint:  Abdominal Pain and 5 Months Preg   First Provider Initiated Contact with Patient 06/09/16 0541     HPI: Patty Espinoza is a 25 y.o. [id number] at 145w3d who [facility]. Cervix FT/60 per [facility] RN. [facility] to cervical exam. Pt was given Procardia 10mg  x 1 and contractions and pelvic pressure improved, but have worsened again. Hx spontaneous PTD x 3. [facility] ([facility] tomorrow.   Duration: 3 days Context: NOne Timing: intermitent Modifying factors: none. There are no aggravating or aleviating factors. Associated signs and symptoms: Pos for frequency. Neg for VB, LOF, vaginal discharge,   Good fetal movement.   Past Medical History: Past Medical History:  Diagnosis Date  . Medical history non-contributory     Past obstetric history: OB History  Gravida Para Term Preterm AB Living  5 3 0 3 1 3   SAB TAB Ectopic Multiple Live Births  1 0 0 0 3    # Outcome Date GA Lbr Len/2nd Weight Sex Delivery Anes PTL Lv  5 Current           4 SAB 06/29/15 3111w0d         3 Preterm 08/08/14 4969w0d  2 lb 9 oz (1.162 kg) M Vag-Spont None Y LIV  2 Preterm 11/03/13 7677w0d  6 lb 14 oz (3.118 kg) M Vag-Spont  Y LIV  1 Preterm 07/23/11 8877w0d  6 lb 5 oz (2.863 kg) M Vag-Spont EPI Y LIV      Past Surgical History: Past Surgical History:  Procedure Laterality Date  . NO PAST SURGERIES       Family History: History reviewed. No pertinent family history.  Social History: Social History  Substance Use Topics  . Smoking status: Former Smoker    Packs/day: 0.50    Years: 6.00    Types: Cigarettes    Quit date: 01/27/2016  . Smokeless tobacco: Never Used  . Alcohol use No    Allergies:  Allergies  Allergen Reactions  . Latex Rash    Meds:  Prescriptions Prior to Admission  Medication Sig Dispense Refill Last Dose  . Prenatal Vit-Fe Fumarate-FA (MULTIVITAMIN-PRENATAL)  27-0.8 MG TABS tablet Take 1 tablet by mouth daily at 12 noon.   06/08/2016 at Unknown time  . metoCLOPramide (REGLAN) 10 MG tablet Take 1 tablet (10 mg total) by mouth every 8 (eight) hours as needed for nausea or vomiting. (Patient not taking: Reported on 06/09/2016) 20 tablet 0 Not Taking at Unknown time    I have reviewed patient's Past Medical Hx, Surgical Hx, Family Hx, Social Hx, medications and allergies.   ROS:  Review of Systems  Constitutional: Negative for chills and fever.  Gastrointestinal: Positive for abdominal pain. Negative for constipation, diarrhea, nausea and vomiting.  Genitourinary: Positive for frequency and pelvic pain. Negative for dysuria, flank pain, hematuria, urgency, vaginal bleeding and vaginal discharge.  Musculoskeletal: Negative for back pain.    Physical Exam  Patient Vitals for the past 24 hrs:  BP Temp Temp src Pulse Resp SpO2  06/09/16 0421 126/67 98 F (36.7 C) Oral 86 18 100 %  06/09/16 0400 122/63 - - - - -  06/09/16 0330 124/74 - - - - -  06/09/16 0300 121/67 - - - - -  06/09/16 0233 130/81 - - 78 22 98 %  06/09/16 0230 130/81 - - 82 - 100 %  06/09/16 0200 114/71 - - 75 - 100 %  06/09/16 0023 121/72 98.7 F (37.1 C) Oral 76 18 100 %   Constitutional: Well-developed, well-nourished female in no acute distress.  Cardiovascular: normal rate Respiratory: normal effort GI: Abd soft, non-tender, gravid, S<4 MS: Extremities nontender, no edema, normal ROM Neurologic: Alert and oriented x 4.  GU: Neg CVAT.  Pelvic: NEFG, physiologic discharge, no blood, cervix clean. No CMT  Dilation: Closed Effacement (%): 60 Cervical Position: Middle Station: -3 Presentation: Undeterminable Exam by:: Ivonne Andrew CNM  FHT:  Baseline 145 , min-moderate variability, 10x10 accelerations present, no decelerations, intermittent tracing due to early GA. Contractions: periods of q 3 minute contractions.   Labs: Results for orders placed or performed during the  hospital encounter of 06/09/16 (from the past 24 hour(s))  Comprehensive metabolic panel     Status: Abnormal   Collection Time: 06/09/16 12:58 AM  Result Value Ref Range   Sodium 134 (L) 135 - 145 mmol/L   Potassium 3.7 3.5 - 5.1 mmol/L   Chloride 108 101 - 111 mmol/L   CO2 21 (L) 22 - 32 mmol/L   Glucose, Bld 87 65 - 99 mg/dL   BUN 5 (L) 6 - 20 mg/dL   Creatinine, Ser 5.36 0.44 - 1.00 mg/dL   Calcium 8.8 (L) 8.9 - 10.3 mg/dL   Total Protein 6.9 6.5 - 8.1 g/dL   Albumin 3.4 (L) 3.5 - 5.0 g/dL   AST 15 15 - 41 U/L   ALT 8 (L) 14 - 54 U/L   Alkaline Phosphatase 48 38 - 126 U/L   Total Bilirubin 0.4 0.3 - 1.2 mg/dL   GFR calc non Af Amer >60 >60 mL/min   GFR calc Af Amer >60 >60 mL/min   Anion gap 5 5 - 15  CBC with Differential     Status: Abnormal   Collection Time: 06/09/16 12:58 AM  Result Value Ref Range   WBC 11.1 (H) 4.0 - 10.5 K/uL   RBC 3.42 (L) 3.87 - 5.11 MIL/uL   Hemoglobin 10.2 (L) 12.0 - 15.0 g/dL   HCT 64.4 (L) 03.4 - 74.2 %   MCV 82.7 78.0 - 100.0 fL   MCH 29.8 26.0 - 34.0 pg   MCHC 36.0 30.0 - 36.0 g/dL   RDW 59.5 63.8 - 75.6 %   Platelets 226 150 - 400 K/uL   Neutrophils Relative % 50 %   Neutro Abs 5.5 1.7 - 7.7 K/uL   Lymphocytes Relative 35 %   Lymphs Abs 3.8 0.7 - 4.0 K/uL   Monocytes Relative 14 %   Monocytes Absolute 1.6 (H) 0.1 - 1.0 K/uL   Eosinophils Relative 1 %   Eosinophils Absolute 0.1 0.0 - 0.7 K/uL   Basophils Relative 0 %   Basophils Absolute 0.0 0.0 - 0.1 K/uL  Urinalysis, Routine w reflex microscopic     Status: None   Collection Time: 06/09/16  1:07 AM  Result Value Ref Range   Color, Urine YELLOW YELLOW   APPearance CLEAR CLEAR   Specific Gravity, Urine 1.012 1.005 - 1.030   pH 6.0 5.0 - 8.0   Glucose, UA NEGATIVE NEGATIVE mg/dL   Hgb urine dipstick NEGATIVE NEGATIVE   Bilirubin Urine NEGATIVE NEGATIVE   Ketones, ur NEGATIVE NEGATIVE mg/dL   Protein, ur NEGATIVE NEGATIVE mg/dL   Nitrite NEGATIVE NEGATIVE   Leukocytes, UA  NEGATIVE NEGATIVE  Wet prep, genital     Status: Abnormal   Collection Time: 06/09/16  1:59  AM  Result Value Ref Range   Yeast Wet Prep HPF POC NONE SEEN NONE SEEN   Trich, Wet Prep NONE SEEN NONE SEEN   Clue Cells Wet Prep HPF POC PRESENT (A) NONE SEEN   WBC, Wet Prep HPF POC MANY (A) NONE SEEN   Sperm NONE SEEN     Imaging:  No results found.  MAU Course: Procardia given. No cervical change. CL 4 cm by [place]. Measuring ~20 weeks by BPD. Will need to adjust GA based on anatomy/dating [place].   MDM: - Preterm contractions w/out evidence of active PTL - S<D - BV may be contributing to preterm contractions/pelvic pain.   Assessment: 1. Preterm contractions, second trimester   2. BV  3. History of preterm delivery     Plan: Discharge home in stable condition.  Preterm Labor precautions and fetal kick counts Follow-up Information    [facility] Follow up on 06/10/2016.   Why:  Prenatal visit Contact information: [address] [place] [address] [phone]       [facility] [place] [facility] ADMISSIONS .   Why:  as needed if symptoms worsen Contact information: [address] [id number] mc [place] [address] [phone]            Medication List    TAKE these medications   metoCLOPramide 10 MG tablet Commonly known as:  REGLAN Take 1 tablet (10 mg total) by mouth every 8 (eight) hours as needed for nausea or vomiting.   metroNIDAZOLE 500 MG tablet Commonly known as:  FLAGYL Take 1 tablet (500 mg total) by mouth 2 (two) times daily.   multivitamin-prenatal 27-0.8 MG Tabs tablet Take 1 tablet by mouth daily at 12 noon.       [place], [place] 06/09/2016 7:58 AM

## 2016-06-09 NOTE — Progress Notes (Signed)
Ivonne AndrewV. Smith CNM notified [facility]. Will see pt

## 2016-06-09 NOTE — ED Notes (Signed)
[facility] spoke with Dr Debroah LoopArnold, told of pt g5 p3, no pnc-[facility], by lmp pt is 25 3/7; has hx of 3 preterm deliveries @ 36, 34, 29weeks; one sab; pt c/o pain in abdomen since 8/28 at 0930; no vaginal bleeding, no leaking of fluid, fetal movement normal for gestation, told of fhr wnl for gestation, pt is rating pain at 6/10; pt feels cramping and pressure in vagina; speculum exam performed and FFN, GC/Chlamydia, wet prep collected; u/a and blood work; 1 liter iv bolus, sve-ft/60/-3 soft and middle/anterior; [facility] [place]; orders for procardia 10mg  po and betamethasone injections im; ED provider and OB provider talked to each other to confirm pt status and transfer.

## 2016-06-09 NOTE — ED Provider Notes (Signed)
[facility]-EMERGENCY DEPT Provider Note   CSN: [id number] Arrival date & time: 06/09/16  0018  By signing my name below, I, Soijett Blue, attest that this documentation has been prepared under the direction and in the presence of Tilden Fossa, MD. Electronically Signed: Soijett Blue, ED Scribe. 06/09/16. 1:03 AM.    History   Chief Complaint Chief Complaint  Patient presents with  . Abdominal Pain  . 5 Months Preg    HPI  Patty Espinoza is a 25 y.o. [id number] female with a medical hx of preterm labor at 29 weeks, who presents to the Emergency Department complaining of intermittent lower abdominal cramping onset 2 days. Pt states that her lower abdominal pain last from 2-30 minutes at a time. Pt reports that she is currently 5 months pregnant. Pt notes that her LMP was 12/14/2015. Pt denies having any prenatal care with this pregnancy. She states that she has not tried any medications for the relief of her symptoms. She denies vaginal discharge, bloody show, fever, chills, and any other symptoms.     The history is provided by the patient. No language interpreter was used.    Past Medical History:  Diagnosis Date  . Medical history non-contributory     There are no active problems to display for this patient.   Past Surgical History:  Procedure Laterality Date  . NO PAST SURGERIES      OB History    Gravida Para Term Preterm AB Living   5 3 0 3 1 3    SAB TAB Ectopic Multiple Live Births   1 0 0 0 3       Home Medications    Prior to Admission medications   Medication Sig Start Date End Date Taking? Authorizing Provider  Prenatal Vit-Fe Fumarate-FA (MULTIVITAMIN-PRENATAL) 27-0.8 MG TABS tablet Take 1 tablet by mouth daily at 12 noon.   Yes Historical Provider, MD  metoCLOPramide (REGLAN) 10 MG tablet Take 1 tablet (10 mg total) by mouth every 8 (eight) hours as needed for nausea or vomiting. Patient not taking: Reported on 06/09/2016 03/24/16   Renne Crigler, PA-C    metroNIDAZOLE (FLAGYL) 500 MG tablet Take 1 tablet (500 mg total) by mouth 2 (two) times daily. 06/09/16   Dorathy Kinsman, CNM    Family History History reviewed. No pertinent family history.  Social History Social History  Substance Use Topics  . Smoking status: Former Smoker    Packs/day: 0.50    Years: 6.00    Types: Cigarettes    Quit date: 01/27/2016  . Smokeless tobacco: Never Used  . Alcohol use No     Allergies   Latex   Review of Systems Review of Systems  Constitutional: Negative for chills and fever.  Gastrointestinal: Positive for abdominal pain (cramping).  Genitourinary: Negative for vaginal discharge.       No bloody show     Physical Exam Updated Vital Signs BP (!) 112/54   Pulse 82   Temp 98.1 F (36.7 C)   Resp 18   LMP 12/14/2015   SpO2 100%   Physical Exam  Constitutional: She is oriented to person, place, and time. She appears well-developed and well-nourished.  HENT:  Head: Normocephalic and atraumatic.  Cardiovascular: Normal rate and regular rhythm.   No murmur heard. Pulmonary/Chest: Effort normal and breath sounds normal. No respiratory distress.  Abdominal: Soft. There is no tenderness. There is no rebound and no guarding.  Gravid uterus to above umbilicus. Non-tender abdomen with fetal activity noted.  Genitourinary:  Genitourinary Comments: Scant white to yellow frothy vaginal discharge  Musculoskeletal: She exhibits no edema or tenderness.  Neurological: She is alert and oriented to person, place, and time.  Skin: Skin is warm and dry.  Psychiatric: She has a normal mood and affect. Her behavior is normal.  Nursing note and vitals reviewed.    ED Treatments / Results  DIAGNOSTIC STUDIES: Oxygen Saturation is 100% on RA, nl by my interpretation.    COORDINATION OF CARE: 12:58 AM Discussed treatment plan with pt at bedside which includes labs and pt agreed to plan.   Labs (all labs ordered are listed, but only abnormal  results are displayed) Labs Reviewed  WET PREP, GENITAL - Abnormal; Notable for the following:       Result Value   Clue Cells Wet Prep HPF POC PRESENT (*)    WBC, Wet Prep HPF POC MANY (*)    All other components within normal limits  COMPREHENSIVE METABOLIC PANEL - Abnormal; Notable for the following:    Sodium 134 (*)    CO2 21 (*)    BUN 5 (*)    Calcium 8.8 (*)    Albumin 3.4 (*)    ALT 8 (*)    All other components within normal limits  CBC WITH DIFFERENTIAL/PLATELET - Abnormal; Notable for the following:    WBC 11.1 (*)    RBC 3.42 (*)    Hemoglobin 10.2 (*)    HCT 28.3 (*)    Monocytes Absolute 1.6 (*)    All other components within normal limits  FETAL FIBRONECTIN - Abnormal; Notable for the following:    Fetal Fibronectin POSITIVE (*)    All other components within normal limits  CULTURE, OB URINE  URINALYSIS, ROUTINE W REFLEX MICROSCOPIC (NOT AT [facility])  GC/CHLAMYDIA PROBE AMP ([place]) NOT AT [facility]    EKG  EKG Interpretation None       Radiology [place] Mfm Ob Transvaginal  Result Date: 06/09/2016 OBSTETRICAL ULTRASOUND: This exam was performed within a [facility]. The OB US report was generated in the AS system, and faxed to the ordering physician.  This report is available in the [employer]. See the AS Obstetric US report via the Image Link.  [place] Mfm Ob Limited  Result Date: 06/09/2016 OBSTETRICAL ULTRASOUND: This exam was performed within a [facility]. The OB US report was generated in the AS system, and faxed to the ordering physician.  This report is available in the [employer]. See the AS Obstetric US report via the Image Link.   Procedures Procedures (including critical care time)  Medications Ordered in ED Medications  sodium chloride 0.9 % bolus 1,000 mL (0 mLs Intravenous Stopped 06/09/16 0240)  NIFEdipine (PROCARDIA) capsule 10 mg (10 mg Oral Given 06/09/16 0245)  betamethasone acetate-betamethasone  sodium phosphate (CELESTONE) injection 12 mg (6 mg Intramuscular Given 06/09/16 0245)     Initial Impression / Assessment and Plan / ED Course  I have reviewed the triage vital signs and the nursing notes.  Pertinent labs & imaging results that were available during my care of the patient were reviewed by me and considered in my medical decision making (see chart for details).  Clinical Course    Patient approximately 5 months pregnant with no prior prenatal care here with abdominal cramping. She has a benign abdominal examination with no evidence of acute abdomen. She did have some uterine irritability on toco. Plan to transfer to [facility] for  further monitoring and assessment. Discussed with Dr. Debroah LoopArnold who agrees to accept the patient in transfer. Patient updated of findings and recommendation for transfer and she is in agreement with plan.  Final Clinical Impressions(s) / ED Diagnoses   Final diagnoses:  Preterm contractions, second trimester    New Prescriptions Discharge Medication List as of 06/09/2016  8:04 AM    START taking these medications   Details  metroNIDAZOLE (FLAGYL) 500 MG tablet Take 1 tablet (500 mg total) by mouth 2 (two) times daily., Starting Sun 06/09/2016, Normal        I personally performed the services described in this documentation, which was scribed in my presence. The recorded information has been reviewed and is accurate.    Tilden FossaElizabeth Mareon Robinette, MD 06/09/16 2255

## 2016-06-09 NOTE — ED Notes (Signed)
Called [facility] for transport 

## 2016-06-09 NOTE — Discharge Instructions (Signed)
Bacterial Vaginosis Bacterial vaginosis is a vaginal infection that occurs when the normal balance of bacteria in the vagina is disrupted. It results from an overgrowth of certain bacteria. This is the most common vaginal infection in women of childbearing age. Treatment is important to prevent complications, especially in pregnant women, as it can cause a premature delivery. CAUSES  Bacterial vaginosis is caused by an increase in harmful bacteria that are normally present in smaller amounts in the vagina. Several different kinds of bacteria can cause bacterial vaginosis. However, the reason that the condition develops is not fully understood. RISK FACTORS Certain activities or behaviors can put you at an increased risk of developing bacterial vaginosis, including:  Having a new sex partner or multiple sex partners.  Douching.  Using an intrauterine device (IUD) for contraception. Women do not get bacterial vaginosis from toilet seats, bedding, swimming pools, or contact with objects around them. SIGNS AND SYMPTOMS  Some women with bacterial vaginosis have no signs or symptoms. Common symptoms include:  Grey vaginal discharge.  A fishlike odor with discharge, especially after sexual intercourse.  Itching or burning of the vagina and vulva.  Burning or pain with urination. DIAGNOSIS  Your health care provider will take a medical history and examine the vagina for signs of bacterial vaginosis. A sample of vaginal fluid may be taken. Your health care provider will look at this sample under a microscope to check for bacteria and abnormal cells. A vaginal pH test may also be done.  TREATMENT  Bacterial vaginosis may be treated with antibiotic medicines. These may be given in the form of a pill or a vaginal cream. A second round of antibiotics may be prescribed if the condition comes back after treatment. Because bacterial vaginosis increases your risk for sexually transmitted diseases, getting  treated can help reduce your risk for chlamydia, gonorrhea, HIV, and herpes. HOME CARE INSTRUCTIONS   Only take over-the-counter or prescription medicines as directed by your health care provider.  If antibiotic medicine was prescribed, take it as directed. Make sure you finish it even if you start to feel better.  Tell all sexual partners that you have a vaginal infection. They should see their health care provider and be treated if they have problems, such as a mild rash or itching.  During treatment, it is important that you follow these instructions:  Avoid sexual activity or use condoms correctly.  Do not douche.  Avoid alcohol as directed by your health care provider.  Avoid breastfeeding as directed by your health care provider. SEEK MEDICAL CARE IF:   Your symptoms are not improving after 3 days of treatment.  You have increased discharge or pain.  You have a fever. MAKE SURE YOU:   Understand these instructions.  Will watch your condition.  Will get help right away if you are not doing well or get worse. FOR MORE INFORMATION  Centers for Disease Control and Prevention, Division of STD Prevention: SolutionApps.co.zawww.cdc.gov/std American Sexual Health Association (ASHA): www.ashastd.org    This information is not intended to replace advice given to you by your health care provider. Make sure you discuss any questions you have with your health care provider.   Document Released: 09/30/2005 Document Revised: 10/21/2014 Document Reviewed: 05/12/2013 Elsevier Interactive Patient Education 2016 [employer]   Preterm Labor Information Preterm labor is when labor starts at less than 37 weeks of pregnancy. The normal length of a pregnancy is 39 to 41 weeks. CAUSES Often, there is no identifiable underlying cause  as to why a woman goes into preterm labor. One of the most common known causes of preterm labor is infection. Infections of the uterus, cervix, vagina, amniotic sac, bladder,  kidney, or even the lungs (pneumonia) can cause labor to start. Other suspected causes of preterm labor include:   Urogenital infections, such as yeast infections and bacterial vaginosis.   Uterine abnormalities (uterine shape, uterine septum, fibroids, or bleeding from the placenta).   A cervix that has been operated on (it may fail to stay closed).   Malformations in the fetus.   Multiple gestations (twins, triplets, and so on).   Breakage of the amniotic sac.  RISK FACTORS  Having a previous history of preterm labor.   Having premature rupture of membranes (PROM).   Having a placenta that covers the opening of the cervix (placenta previa).   Having a placenta that separates from the uterus (placental abruption).   Having a cervix that is too weak to hold the fetus in the uterus (incompetent cervix).   Having too much fluid in the amniotic sac (polyhydramnios).   Taking illegal drugs or smoking while pregnant.   Not gaining enough weight while pregnant.   Being younger than 4918 and older than 23 years old.   Having a low socioeconomic status.   Being African American. SYMPTOMS Signs and symptoms of preterm labor include:   Menstrual-like cramps, abdominal pain, or back pain.  Uterine contractions that are regular, as frequent as six in an hour, regardless of their intensity (may be mild or painful).  Contractions that start on the top of the uterus and spread down to the lower abdomen and back.   A sense of increased pelvic pressure.   A watery or bloody mucus discharge that comes from the vagina.  TREATMENT Depending on the length of the pregnancy and other circumstances, your health care provider may suggest bed rest. If necessary, there are medicines that can be given to stop contractions and to mature the fetal lungs. If labor happens before 34 weeks of pregnancy, a prolonged hospital stay may be recommended. Treatment depends on the condition  of both you and the fetus.  WHAT SHOULD YOU DO IF YOU THINK YOU ARE IN PRETERM LABOR? Call your health care provider right away. You will need to go to the hospital to get checked immediately. HOW CAN YOU PREVENT PRETERM LABOR IN FUTURE PREGNANCIES? You should:   Stop smoking if you smoke.  Maintain healthy weight gain and avoid chemicals and drugs that are not necessary.  Be watchful for any type of infection.  Inform your health care provider if you have a known history of preterm labor.   This information is not intended to replace advice given to you by your health care provider. Make sure you discuss any questions you have with your health care provider.   Document Released: 12/21/2003 Document Revised: 06/02/2013 Document Reviewed: 11/02/2012 Elsevier Interactive Patient Education [employer]2016 Elsevier Inc.

## 2016-06-10 ENCOUNTER — Encounter: Payer: Self-pay | Admitting: Obstetrics & Gynecology

## 2016-06-10 ENCOUNTER — Ambulatory Visit (INDEPENDENT_AMBULATORY_CARE_PROVIDER_SITE_OTHER): Payer: Medicaid Other | Admitting: Obstetrics & Gynecology

## 2016-06-10 ENCOUNTER — Encounter: Payer: Self-pay | Admitting: *Deleted

## 2016-06-10 ENCOUNTER — Ambulatory Visit: Payer: Medicaid Other

## 2016-06-10 VITALS — BP 105/69 | HR 67 | Wt 132.5 lb

## 2016-06-10 DIAGNOSIS — Z1389 Encounter for screening for other disorder: Secondary | ICD-10-CM | POA: Diagnosis not present

## 2016-06-10 DIAGNOSIS — O09219 Supervision of pregnancy with history of pre-term labor, unspecified trimester: Secondary | ICD-10-CM | POA: Insufficient documentation

## 2016-06-10 DIAGNOSIS — O0990 Supervision of high risk pregnancy, unspecified, unspecified trimester: Secondary | ICD-10-CM | POA: Insufficient documentation

## 2016-06-10 DIAGNOSIS — Z331 Pregnant state, incidental: Secondary | ICD-10-CM

## 2016-06-10 DIAGNOSIS — O0992 Supervision of high risk pregnancy, unspecified, second trimester: Secondary | ICD-10-CM

## 2016-06-10 DIAGNOSIS — Z3492 Encounter for supervision of normal pregnancy, unspecified, second trimester: Secondary | ICD-10-CM | POA: Diagnosis not present

## 2016-06-10 LAB — POCT URINALYSIS DIPSTICK
Bilirubin, UA: NEGATIVE
GLUCOSE UA: NEGATIVE
KETONES UA: NEGATIVE
LEUKOCYTES UA: NEGATIVE
Nitrite, UA: NEGATIVE
SPEC GRAV UA: 1.01
Urobilinogen, UA: 0.2
pH, UA: 5

## 2016-06-10 LAB — GC/CHLAMYDIA PROBE AMP (~~LOC~~) NOT AT ARMC
CHLAMYDIA, DNA PROBE: NEGATIVE
NEISSERIA GONORRHEA: NEGATIVE

## 2016-06-10 MED ORDER — HYDROXYPROGESTERONE CAPROATE 250 MG/ML IM OIL
250.0000 mg | TOPICAL_OIL | Freq: Once | INTRAMUSCULAR | Status: AC
  Administered 2016-07-08: 250 mg via INTRAMUSCULAR

## 2016-06-10 NOTE — Progress Notes (Signed)
Pt denies concerns at this time. 

## 2016-06-10 NOTE — Progress Notes (Signed)
  Subjective:h/o PTB    Patty Espinoza is a [id number] 3464w0d being seen today for her first obstetrical visit.  Her obstetrical history is significant for history of preterm birth. Patient does intend to breast feed. Pregnancy history fully reviewed. She was seen for contractions in MAU yesterday and limited US was done Patient reports no complaints.  Vitals:   06/10/16 1102  BP: 105/69  Pulse: 67  Weight: 132 lb 8 oz (60.1 kg)    HISTORY: OB History  Gravida Para Term Preterm AB Living  5 3 0 3 1 3   SAB TAB Ectopic Multiple Live Births  1 0 0 0 3    # Outcome Date GA Lbr Len/2nd Weight Sex Delivery Anes PTL Lv  5 Current           4 SAB 06/29/15 2511w0d         3 Preterm 08/08/14 2571w0d  2 lb 9 oz (1.162 kg) M Vag-Spont None Y LIV  2 Preterm 11/03/13 8057w0d  6 lb 14 oz (3.118 kg) M Vag-Spont  Y LIV  1 Preterm 07/23/11 1175w0d  6 lb 5 oz (2.863 kg) M Vag-Spont EPI Y LIV     Past Medical History:  Diagnosis Date  . Medical history non-contributory    Past Surgical History:  Procedure Laterality Date  . NO PAST SURGERIES     Family History  Problem Relation Age of Onset  . Hypertension Mother   . Diabetes Father   . Hypertension Father   . Diabetes Maternal Grandmother   . Hypertension Maternal Grandmother   . Alcohol abuse Maternal Grandfather   . Arthritis Maternal Grandfather   . COPD Maternal Grandfather   . Diabetes Maternal Grandfather   . Hypertension Maternal Grandfather   . Hyperlipidemia Maternal Grandfather   . Vision loss Maternal Grandfather      Exam    Uterus:     Pelvic Exam:    Perineum: No Hemorrhoids   Vulva: normal   Vagina:  normal mucosa   pH:     Cervix: no lesions   Adnexa: not evaluated   Bony Pelvis: average  System: Breast:  normal appearance, no masses or tenderness   Skin: normal coloration and turgor, no rashes    Neurologic: oriented, normal mood   Extremities: normal strength, tone, and muscle mass   HEENT neck supple with  midline trachea   Mouth/Teeth dental hygiene good   Neck supple   Cardiovascular: regular rate and rhythm   Respiratory:  appears well, vitals normal, no respiratory distress, acyanotic, normal RR, neck free of mass or lymphadenopathy, chest clear, no wheezing, crepitations, rhonchi, normal symmetric air entry   Abdomen: soft, non-tender; bowel sounds normal; no masses,  no organomegaly   Urinary: urethral meatus normal      Assessment:    Pregnancy: [id number] Patient Active Problem List   Diagnosis Date Noted  . Supervision of high risk pregnancy, antepartum 06/10/2016  . High risk pregnancy due to history of preterm labor, antepartum 06/10/2016        Plan:     Initial labs drawn. Prenatal vitamins. Problem list reviewed and updated. Genetic Screening discussed   Ultrasound discussed; fetal survey: ordered.  Follow up in 4 weeks. 50% of 30 min visit spent on counseling and coordination of care.  Candidate for Makena injection weekly and this was ordered   Bryanah Sidell 06/10/2016

## 2016-06-10 NOTE — Progress Notes (Unsigned)
Patient was expected today to come for betamethasone injection #1 of 2. Medication returned to pharmacy. [facility], left message with them informing of no show.

## 2016-06-11 LAB — PRENATAL PROFILE I(LABCORP)
ANTIBODY SCREEN: NEGATIVE
BASOS ABS: 0 10*3/uL (ref 0.0–0.2)
BASOS: 0 %
EOS (ABSOLUTE): 0 10*3/uL (ref 0.0–0.4)
Eos: 0 %
HEMATOCRIT: 33 % — AB (ref 34.0–46.6)
HEMOGLOBIN: 10.9 g/dL — AB (ref 11.1–15.9)
Hepatitis B Surface Ag: NEGATIVE
IMMATURE GRANULOCYTES: 0 %
Immature Grans (Abs): 0 10*3/uL (ref 0.0–0.1)
LYMPHS ABS: 3.2 10*3/uL — AB (ref 0.7–3.1)
Lymphs: 27 %
MCH: 28.8 pg (ref 26.6–33.0)
MCHC: 33 g/dL (ref 31.5–35.7)
MCV: 87 fL (ref 79–97)
MONOCYTES: 10 %
MONOS ABS: 1.2 10*3/uL — AB (ref 0.1–0.9)
NEUTROS ABS: 7.3 10*3/uL — AB (ref 1.4–7.0)
Neutrophils: 63 %
PLATELETS: 260 10*3/uL (ref 150–379)
RBC: 3.78 x10E6/uL (ref 3.77–5.28)
RDW: 15.3 % (ref 12.3–15.4)
RPR: NONREACTIVE
Rh Factor: POSITIVE
Rubella Antibodies, IGG: 2.74 index (ref >0.99)
WBC: 11.8 10*3/uL — ABNORMAL HIGH (ref 3.4–10.8)

## 2016-06-12 ENCOUNTER — Encounter: Payer: Self-pay | Admitting: Advanced Practice Midwife

## 2016-06-12 DIAGNOSIS — O358XX0 Maternal care for other (suspected) fetal abnormality and damage, not applicable or unspecified: Secondary | ICD-10-CM | POA: Insufficient documentation

## 2016-06-12 DIAGNOSIS — IMO0002 Reserved for concepts with insufficient information to code with codable children: Secondary | ICD-10-CM | POA: Insufficient documentation

## 2016-06-12 LAB — URINE CULTURE, OB REFLEX

## 2016-06-12 LAB — CULTURE, OB URINE
Culture: 50000 — AB
SPECIAL REQUESTS: NORMAL

## 2016-06-12 LAB — GC/CHLAMYDIA PROBE AMP
CHLAMYDIA, DNA PROBE: NEGATIVE
Neisseria gonorrhoeae by PCR: NEGATIVE

## 2016-06-12 MED ORDER — HYDROXYPROGESTERONE CAPROATE 250 MG/ML IM OIL: 250.0000 mg | TOPICAL_OIL | INTRAMUSCULAR | 3 refills | Status: DC

## 2016-06-12 NOTE — Addendum Note (Signed)
Addended by: Elby BeckPAUL, Krystianna Soth F on: 06/12/2016 09:25 AM   Modules accepted: Orders

## 2016-06-13 ENCOUNTER — Telehealth: Payer: Self-pay | Admitting: General Practice

## 2016-06-13 ENCOUNTER — Telehealth: Payer: Self-pay | Admitting: *Deleted

## 2016-06-13 DIAGNOSIS — N390 Urinary tract infection, site not specified: Secondary | ICD-10-CM

## 2016-06-13 LAB — PAP IG W/ RFLX HPV ASCU: PAP Smear Comment: 0

## 2016-06-13 MED ORDER — AMOXICILLIN 500 MG PO CAPS: 500.0000 mg | ORAL_CAPSULE | Freq: Three times a day (TID) | ORAL | 0 refills | Status: AC

## 2016-06-13 NOTE — Telephone Encounter (Signed)
Called makena re: fax received stating payer will not disclose benefits to third party.  Patient is a [facility] patient but was supposed to come to our office for betamethasone- I thought per chart review coming her for 17P.  [facility] [place] who doesn't disclose to 3rd party has been referred to [facility] because is " off-label". [facility] calls [facility]. [phone]

## 2016-06-13 NOTE — Telephone Encounter (Signed)
Per Patty LarryMelanie Espinoza, patient has uti and antibiotic has been sent to pharmacy. Called patient, no answer- VM box was that of a man. Left message for [name] to call us back for non urgent results. Will send mychart message.

## 2016-06-19 LAB — HIV ANTIBODY (ROUTINE TESTING W REFLEX): HIV SCREEN 4TH GENERATION: NONREACTIVE

## 2016-06-19 LAB — TOXASSURE SELECT 13 (MW), URINE: PDF: 0

## 2016-07-02 ENCOUNTER — Inpatient Hospital Stay (HOSPITAL_COMMUNITY): Payer: Medicaid Other

## 2016-07-02 ENCOUNTER — Encounter (HOSPITAL_COMMUNITY): Payer: Self-pay

## 2016-07-02 ENCOUNTER — Inpatient Hospital Stay (HOSPITAL_COMMUNITY)
Admission: AD | Admit: 2016-07-02 | Discharge: 2016-07-02 | Disposition: A | Discharge status: Home / Self Care | Payer: Medicaid Other | Source: Ambulatory Visit | Attending: Obstetrics and Gynecology | Admitting: Obstetrics and Gynecology

## 2016-07-02 DIAGNOSIS — O368390 Maternal care for abnormalities of the fetal heart rate or rhythm, unspecified trimester, not applicable or unspecified: Secondary | ICD-10-CM

## 2016-07-02 DIAGNOSIS — O26852 Spotting complicating pregnancy, second trimester: Secondary | ICD-10-CM | POA: Diagnosis not present

## 2016-07-02 DIAGNOSIS — O4692 Antepartum hemorrhage, unspecified, second trimester: Secondary | ICD-10-CM

## 2016-07-02 DIAGNOSIS — N898 Other specified noninflammatory disorders of vagina: Secondary | ICD-10-CM

## 2016-07-02 DIAGNOSIS — Z8751 Personal history of pre-term labor: Secondary | ICD-10-CM

## 2016-07-02 DIAGNOSIS — Z3A24 24 weeks gestation of pregnancy: Secondary | ICD-10-CM | POA: Diagnosis not present

## 2016-07-02 DIAGNOSIS — N939 Abnormal uterine and vaginal bleeding, unspecified: Secondary | ICD-10-CM

## 2016-07-02 DIAGNOSIS — O26892 Other specified pregnancy related conditions, second trimester: Secondary | ICD-10-CM

## 2016-07-02 DIAGNOSIS — Z87891 Personal history of nicotine dependence: Secondary | ICD-10-CM | POA: Insufficient documentation

## 2016-07-02 LAB — URINE MICROSCOPIC-ADD ON: WBC, UA: NONE SEEN WBC/hpf (ref 0–5)

## 2016-07-02 LAB — URINALYSIS, ROUTINE W REFLEX MICROSCOPIC
Bilirubin Urine: NEGATIVE
GLUCOSE, UA: NEGATIVE mg/dL
Ketones, ur: NEGATIVE mg/dL
LEUKOCYTES UA: NEGATIVE
Nitrite: NEGATIVE
PROTEIN: NEGATIVE mg/dL
SPECIFIC GRAVITY, URINE: 1.01 (ref 1.005–1.030)
pH: 6 (ref 5.0–8.0)

## 2016-07-02 LAB — WET PREP, GENITAL
Clue Cells Wet Prep HPF POC: NONE SEEN
SPERM: NONE SEEN
Trich, Wet Prep: NONE SEEN
WBC, Wet Prep HPF POC: NONE SEEN
YEAST WET PREP: NONE SEEN

## 2016-07-02 LAB — FETAL FIBRONECTIN: Fetal Fibronectin: POSITIVE — AB

## 2016-07-02 NOTE — MAU Note (Signed)
Pt C/O spotting that started @ 0730 this morning.  Denies pain but states she has some pressure, denies LOF.

## 2016-07-02 NOTE — Discharge Instructions (Signed)
Vaginal Bleeding During Pregnancy, Second Trimester ° A small amount of bleeding (spotting) from the vagina is common in pregnancy. Sometimes the bleeding is normal and is not a problem, and sometimes it is a sign of something serious. Be sure to tell your doctor about any bleeding from your vagina right away. °HOME CARE °· Watch your condition for any changes. °· Follow your doctor's instructions about how active you can be. °· If you are on bed rest: °¨ You may need to stay in bed and only get up to use the bathroom. °¨ You may be allowed to do some activities. °¨ If you need help, make plans for someone to help you. °· Write down: °¨ The number of pads you use each day. °¨ How often you change pads. °¨ How soaked (saturated) your pads are. °· Do not use tampons. °· Do not douche. °· Do not have sex or orgasms until your doctor says it is okay. °· If you pass any tissue from your vagina, save the tissue so you can show it to your doctor. °· Only take medicines as told by your doctor. °· Do not take aspirin because it can make you bleed. °· Do not exercise, lift heavy weights, or do any activities that take a lot of energy and effort unless your doctor says it is okay. °· Keep all follow-up visits as told by your doctor. °GET HELP IF:  °· You bleed from your vagina. °· You have cramps. °· You have labor pains. °· You have a fever that does not go away after you take medicine. °GET HELP RIGHT AWAY IF: °· You have very bad cramps in your back or belly (abdomen). °· You have contractions. °· You have chills. °· You pass large clots or tissue from your vagina. °· You bleed more. °· You feel light-headed or weak. °· You pass out (faint). °· You are leaking fluid or have a gush of fluid from your vagina. °MAKE SURE YOU: °· Understand these instructions. °· Will watch your condition. °· Will get help right away if you are not doing well or get worse. °  °This information is not intended to replace advice given to you by  your health care provider. Make sure you discuss any questions you have with your health care provider. °  °Document Released: 02/14/2014 Document Reviewed: 02/14/2014 °Elsevier Interactive Patient Education ©2016 Elsevier Inc. ° °

## 2016-07-02 NOTE — MAU Provider Note (Signed)
History     CSN: [id number]  Arrival date and time: 07/02/16 4098   First Provider Initiated Contact with Patient 07/02/16 1127      Chief Complaint  Patient presents with  . Vaginal Bleeding   Patty Espinoza is a 25 y.o. [id number] at [redacted]w[redacted]d who presents with c/o spotting. States this morning urinated and passed BM when noticed streak of pink when wiping x1. Has h/o preterm birth x3, states previous all started with similar presentation so was concerned. Was seen recently in MAU on 06/09/16 wa given BMZ#1 at that time. No Makena or 17P this pregnancy d/t late prenatal care. Denies LOF or contractions. Endorses good fetal movement.    OB History    Gravida Para Term Preterm AB Living   5 3 0 3 1 3    SAB TAB Ectopic Multiple Live Births   1 0 0 0 3      Past Medical History:  Diagnosis Date  . Medical history non-contributory     Past Surgical History:  Procedure Laterality Date  . NO PAST SURGERIES      Family History  Problem Relation Age of Onset  . Hypertension Mother   . Diabetes Father   . Hypertension Father   . Diabetes Maternal Grandmother   . Hypertension Maternal Grandmother   . Alcohol abuse Maternal Grandfather   . Arthritis Maternal Grandfather   . COPD Maternal Grandfather   . Diabetes Maternal Grandfather   . Hypertension Maternal Grandfather   . Hyperlipidemia Maternal Grandfather   . Vision loss Maternal Grandfather     Social History  Substance Use Topics  . Smoking status: Former Smoker    Packs/day: 0.50    Years: 6.00    Types: Cigarettes    Quit date: 01/27/2016  . Smokeless tobacco: Never Used  . Alcohol use No    Allergies:  Allergies  Allergen Reactions  . Latex Rash    Facility-Administered Medications Prior to Admission  Medication Dose Route Frequency Provider Last Rate Last Dose  . hydroxyprogesterone caproate (MAKENA) 250 mg/mL injection 250 mg  250 mg Intramuscular Once Adam Phenix, MD       Prescriptions Prior to  Admission  Medication Sig Dispense Refill Last Dose  . amoxicillin (AMOXIL) 500 MG capsule Take 500 mg by mouth 3 (three) times daily.   07/01/2016 at Unknown time  . metoCLOPramide (REGLAN) 10 MG tablet Take 1 tablet (10 mg total) by mouth every 8 (eight) hours as needed for nausea or vomiting. 20 tablet 0 Past Week at Unknown time  . Prenatal Vit-Fe Fumarate-FA (MULTIVITAMIN-PRENATAL) 27-0.8 MG TABS tablet Take 1 tablet by mouth daily at 12 noon.   07/01/2016 at Unknown time  . metroNIDAZOLE (FLAGYL) 500 MG tablet Take 1 tablet (500 mg total) by mouth 2 (two) times daily. (Patient not taking: Reported on 07/02/2016) 14 tablet 0 Not Taking at Unknown time    Review of Systems  Constitutional: Negative for chills and fever.  Eyes: Negative for blurred vision, double vision and photophobia.  Respiratory: Negative for shortness of breath.   Cardiovascular: Negative for chest pain.  Gastrointestinal: Negative for abdominal pain, heartburn, nausea and vomiting.  Genitourinary: Negative for dysuria, frequency and urgency.  Neurological: Negative for headaches.   Physical Exam   Blood pressure 114/60, pulse 64, temperature 98.5 F (36.9 C), temperature source Oral, resp. rate 16, last menstrual period 12/14/2015.  Physical Exam  Constitutional: She is oriented to person, place, and time. She appears well-developed and  well-nourished. No distress.  HENT:  Head: Normocephalic and atraumatic.  Cardiovascular: Normal rate, regular rhythm and normal heart sounds.   No murmur heard. Respiratory: Effort normal and breath sounds normal. No respiratory distress. She has no wheezes.  GI: Soft. Bowel sounds are normal. There is no tenderness. There is no rebound and no guarding.  Genitourinary: Vagina normal.  Neurological: She is alert and oriented to person, place, and time.  Skin: Skin is warm and dry.  Psychiatric: She has a normal mood and affect.    MAU Course  Procedures  MDM FFN  positive Wet mount negative Cervical exam closed, anterior, -2 Transvaginal ultrasound showed cervical length of 3.5 cm and largest pocket of amniotic fluid 5.9 cm  Assessment and Plan  IUP@24 .1 - follow up as outpatient, spoke at length with patient about importance of follow up - given return precautions and labor precautions  Leland HerElsia J Yoo PGY-1 07/02/2016, 11:58 AM    OB FELLOW MAU DISCHARGE ATTESTATION  I have seen and examined this patient; I agree with above documentation in the resident's note. FFN was likely positive due to history of bleeding. Cervical length shows unlikely dilating or in labor. No contractions. Preterm labor precautions given.  I personally reviewed the patient's NST today, found to be REACTIVE. 135 bpm, mod var, +accels, no decels. CTX:  NONE   Jen MowElizabeth Ansleigh Safer, DO OB Fellow 11:14 PM

## 2016-07-02 NOTE — Addendum Note (Signed)
Addended by: Pennie BanterSMITH, MARNI W on: 07/02/2016 12:00 PM   Modules accepted: Orders

## 2016-07-04 ENCOUNTER — Other Ambulatory Visit: Payer: Medicaid Other

## 2016-07-08 ENCOUNTER — Ambulatory Visit (INDEPENDENT_AMBULATORY_CARE_PROVIDER_SITE_OTHER): Payer: Medicaid Other | Admitting: Family

## 2016-07-08 VITALS — BP 99/65 | HR 74 | Temp 98.8°F | Wt 138.8 lb

## 2016-07-08 DIAGNOSIS — O09219 Supervision of pregnancy with history of pre-term labor, unspecified trimester: Secondary | ICD-10-CM | POA: Diagnosis not present

## 2016-07-08 DIAGNOSIS — O09892 Supervision of other high risk pregnancies, second trimester: Secondary | ICD-10-CM

## 2016-07-08 DIAGNOSIS — O0992 Supervision of high risk pregnancy, unspecified, second trimester: Secondary | ICD-10-CM

## 2016-07-08 DIAGNOSIS — O358XX1 Maternal care for other (suspected) fetal abnormality and damage, fetus 1: Secondary | ICD-10-CM | POA: Diagnosis not present

## 2016-07-08 DIAGNOSIS — IMO0001 Reserved for inherently not codable concepts without codable children: Secondary | ICD-10-CM

## 2016-07-08 NOTE — Progress Notes (Signed)
   PRENATAL VISIT NOTE  Subjective:  Patty Espinoza is a 25 y.o. [id number] at 1319w0d being seen today for ongoing prenatal care.  She is currently monitored for the following issues for this high-risk pregnancy and has Supervision of high risk pregnancy, antepartum; High risk pregnancy due to history of preterm labor, antepartum; and Fetal cardiac echogenic focus on her problem list.  Patient reports intermittent pressure, but no consistent contractions.  Contractions: Not present. Vag. Bleeding: None.  Movement: Present. Denies leaking of fluid.   The following portions of the patient's history were reviewed and updated as appropriate: allergies, current medications, past family history, past medical history, past social history, past surgical history and problem list. Problem list updated.  Objective:   Vitals:   07/08/16 1332  BP: 99/65  Pulse: 74  Temp: 98.8 F (37.1 C)  Weight: 138 lb 12.8 oz (63 kg)    Fetal Status: Fetal Heart Rate (bpm): 136 Fundal Height: 26 cm Movement: Present     General:  Alert, oriented and cooperative. Patient is in no acute distress.  Skin: Skin is warm and dry. No rash noted.   Cardiovascular: Normal heart rate noted  Respiratory: Normal respiratory effort, no problems with respiration noted  Abdomen: Soft, gravid, appropriate for gestational age. Pain/Pressure: Present     Pelvic:  Cervical exam deferred        Extremities: Normal range of motion.  Edema: None  Mental Status: Normal mood and affect. Normal behavior. Normal judgment and thought content.   Urinalysis: Urine Protein: Negative Urine Glucose: Negative  Assessment and Plan:  Pregnancy: [id number] at 5819w0d  1. Supervision of high risk pregnancy, antepartum, second trimester - Reviewed prenatal labs - Anatomy ultrasound scheduled for 08/01/16  2.  Fetal cardiac echogenic focus, fetus 1 - Discussed finding found on limited ultrasound; explained usually benign with no additional findings;  complete anatomy pending  3. High risk pregnancy due to history of preterm labor, antepartum - 17p today  Preterm labor symptoms and general obstetric precautions including but not limited to vaginal bleeding, contractions, leaking of fluid and fetal movement were reviewed in detail with the patient. Please refer to After Visit Summary for other counseling recommendations.  Return in 3 weeks (on 07/29/2016) for 17p weekly and OB appt in 3 wks w/2 hr.  Marlis EdelsonWalidah N Karim, CNM

## 2016-07-08 NOTE — Progress Notes (Signed)
Patient is in office and states that she has pressure that comes and goes, but overall feels well.

## 2016-07-08 NOTE — Patient Instructions (Addendum)
Echogenic intracardiac focus (EIF) is a small bright spot seen in the baby's heart on an ultrasound exam. This is thought to represent mineralization, or small deposits of calcium, in the muscle of the heart. EIFs are found in about 3-5% of normal pregnancies and cause no health problems.    AREA PEDIATRIC/FAMILY PRACTICE PHYSICIANS  [facility] [address], [address] [place], [place]  [address] Phone - [phone]   Fax - [phone]  [facility] [address] [place], [place] [address] Phone - [phone]   Fax - [phone]  JACK AMOS [address] [place], [place]  [address] Phone - [phone]   Fax - [phone]  [facility] [address], [address] [place], [place]  [address] Phone - [phone]   Fax - [phone]  [facility] [address] [place], [place]  [address] Phone - [phone]   Fax - [phone]  [facility] [address], [address] [place], [place]  [address] Phone - [phone]   Fax - [phone]  [facility] [address], [address] [place], [place]  [address] Phone - [phone]   Fax - [phone]  [facility] [address], [address] [place], [place]  [address] Phone - [phone]   Fax - [phone]  [facility] [address] [place], [place]  [address] Phone - [phone]   Fax - [phone] [facility] [address] [place], [place]  [address] Phone - [phone]   Fax - [phone]  [facility] [address] [place], [place]  [address] Phone - [phone]   Fax - [phone]  [facility] [address], [address] [place], [place]  [address] Phone - [phone]   Fax - [phone]  [facility] [address], [address] [place], [place]   [address] Phone - [phone]   Fax - [phone]  [facility] [address], [address] [place], [place]  [address] Phone - [phone]  [facility] [address] [place], [place]  [address] Phone - [phone]   Fax - [phone]  [facility] [address], [address] [place], [place]  [address] Phone - [phone]   Fax - [phone]  [facility] [address] [place], [place]  [address] Phone - [phone]   Fax - [phone]  [facility] [address] [place], [place]  [address] Phone - [phone]   Fax - [phone]   [facility] [address] [place], [place]  [address] Phone - [phone]   Fax - [phone]  [facility] [address], [address] [place], [place]  [address] Phone - [phone]   Fax - [phone]  DAVID RUBIN [address], [address] [place], [place]  [address] Phone - [phone]   Fax - [phone]  [facility] [address] [place], [place]  [address] Phone - [phone]   Fax - [phone]  [facility] [address] [place], [place]  [address] Phone - [phone]   Fax - [phone] [facility] [place] [address] [place], [place]  [address] Phone - [phone]   Fax - [phone]  [facility] [address] [place], [place]  [address] Phone - [phone]   Fax - [phone]  [facility] [place] [phone] [address], [address] [place], [place]  [address] Phone - [phone]   Fax - [phone]     Third Trimester of Pregnancy The third trimester is from week 29 through week 42, months 7 through 9. The third trimester is a time when the fetus is growing rapidly. At the end of the ninth month, the fetus is about 20 inches in length and weighs 6-10 pounds.  BODY CHANGES Your body goes through many changes during pregnancy. The  changes vary from woman to woman.   Your weight will continue to increase. You can expect to gain 25-35 pounds (11-16 kg) by the end of the pregnancy.  You may begin to get stretch marks on your hips, abdomen, and breasts.  You may urinate more often because the fetus is moving lower into your pelvis and pressing on your bladder.  You may develop or continue to have heartburn as a result of your pregnancy.  You may develop constipation because certain hormones are causing the muscles that push waste through your intestines to slow down.  You may develop hemorrhoids or swollen, bulging veins (varicose veins).  You may have pelvic pain because of the weight gain and pregnancy hormones relaxing your joints between the bones in your pelvis. Backaches may result from overexertion of the muscles supporting your posture.  You may have changes in your hair. These can include thickening of your hair, rapid growth, and changes in texture. Some women also have hair loss during or after pregnancy, or hair that feels dry or thin. Your hair will most likely return to normal after your baby is born.  Your breasts will continue to grow and be tender. A yellow discharge may leak from your breasts called colostrum.  Your belly button may stick out.  You may feel short of breath because of your expanding uterus.  You may notice the fetus "dropping," or moving lower in your abdomen.  You may have a bloody mucus discharge. This usually occurs a few days to a week before labor begins.  Your cervix becomes thin and soft (effaced) near your due date. WHAT TO EXPECT AT YOUR PRENATAL EXAMS  You will have prenatal exams every 2 weeks until week 36. Then, you will have weekly prenatal exams. During a routine prenatal visit:  You will be weighed to make sure you and the fetus are growing normally.  Your blood pressure is taken.  Your abdomen will be measured to track your baby's growth.  The fetal heartbeat  will be listened to.  Any test results from the previous visit will be discussed.  You may have a cervical check near your due date to see if you have effaced. At around 36 weeks, your caregiver will check your cervix. At the same time, your caregiver will also perform a test on the secretions of the vaginal tissue. This test is to determine if a type of bacteria, Group B streptococcus, is present. Your caregiver will explain this further. Your caregiver may ask you:  What your birth plan is.  How you are feeling.  If you are feeling the baby move.  If you have had any abnormal symptoms, such as leaking fluid, bleeding, severe headaches, or abdominal cramping.  If you are using any tobacco products, including cigarettes, chewing tobacco, and electronic cigarettes.  If you have any questions. Other tests or screenings that may be performed during your third trimester include:  Blood tests that check for low iron levels (anemia).  Fetal testing  to check the health, activity level, and growth of the fetus. Testing is done if you have certain medical conditions or if there are problems during the pregnancy.  HIV (human immunodeficiency virus) testing. If you are at high risk, you may be screened for HIV during your third trimester of pregnancy. FALSE LABOR You may feel small, irregular contractions that eventually go away. These are called Braxton Hicks contractions, or false labor. Contractions may last for hours, days, or even weeks before true labor sets in. If contractions come at regular intervals, intensify, or become painful, it is best to be seen by your caregiver.  SIGNS OF LABOR   Menstrual-like cramps.  Contractions that are 5 minutes apart or less.  Contractions that start on the top of the uterus and spread down to the lower abdomen and back.  A sense of increased pelvic pressure or back pain.  A watery or bloody mucus discharge that comes from the vagina. If you have  any of these signs before the 37th week of pregnancy, call your caregiver right away. You need to go to the hospital to get checked immediately. HOME CARE INSTRUCTIONS   Avoid all smoking, herbs, alcohol, and unprescribed drugs. These chemicals affect the formation and growth of the baby.  Do not use any tobacco products, including cigarettes, chewing tobacco, and electronic cigarettes. If you need help quitting, ask your health care provider. You may receive counseling support and other resources to help you quit.  Follow your caregiver's instructions regarding medicine use. There are medicines that are either safe or unsafe to take during pregnancy.  Exercise only as directed by your caregiver. Experiencing uterine cramps is a good sign to stop exercising.  Continue to eat regular, healthy meals.  Wear a good support bra for breast tenderness.  Do not use hot tubs, steam rooms, or saunas.  Wear your seat belt at all times when driving.  Avoid raw meat, uncooked cheese, cat litter boxes, and soil used by cats. These carry germs that can cause birth defects in the baby.  Take your prenatal vitamins.  Take 1500-2000 mg of calcium daily starting at the 20th week of pregnancy until you deliver your baby.  Try taking a stool softener (if your caregiver approves) if you develop constipation. Eat more high-fiber foods, such as fresh vegetables or fruit and whole grains. Drink plenty of fluids to keep your urine clear or pale yellow.  Take warm sitz baths to soothe any pain or discomfort caused by hemorrhoids. Use hemorrhoid cream if your caregiver approves.  If you develop varicose veins, wear support hose. Elevate your feet for 15 minutes, 3-4 times a day. Limit salt in your diet.  Avoid heavy lifting, wear low heal shoes, and practice good posture.  Rest a lot with your legs elevated if you have leg cramps or low back pain.  Visit your dentist if you have not gone during your  pregnancy. Use a soft toothbrush to brush your teeth and be gentle when you floss.  A sexual relationship may be continued unless your caregiver directs you otherwise.  Do not travel far distances unless it is absolutely necessary and only with the approval of your caregiver.  Take prenatal classes to understand, practice, and ask questions about the labor and delivery.  Make a trial run to the hospital.  Pack your hospital bag.  Prepare the baby's nursery.  Continue to go to all your prenatal visits as directed by your caregiver. SEEK MEDICAL CARE IF:  You are unsure if you are in labor or if your water has broken.  You have dizziness.  You have mild pelvic cramps, pelvic pressure, or nagging pain in your abdominal area.  You have persistent nausea, vomiting, or diarrhea.  You have a bad smelling vaginal discharge.  You have pain with urination. SEEK IMMEDIATE MEDICAL CARE IF:   You have a fever.  You are leaking fluid from your vagina.  You have spotting or bleeding from your vagina.  You have severe abdominal cramping or pain.  You have rapid weight loss or gain.  You have shortness of breath with chest pain.  You notice sudden or extreme swelling of your face, hands, ankles, feet, or legs.  You have not felt your baby move in over an hour.  You have severe headaches that do not go away with medicine.  You have vision changes.   This information is not intended to replace advice given to you by your health care provider. Make sure you discuss any questions you have with your health care provider.   Document Released: 09/24/2001 Document Revised: 10/21/2014 Document Reviewed: 12/01/2012 Elsevier Interactive Patient Education [employer]

## 2016-07-29 ENCOUNTER — Encounter: Payer: Self-pay | Admitting: Obstetrics and Gynecology

## 2016-07-29 ENCOUNTER — Ambulatory Visit: Payer: Medicaid Other

## 2016-07-29 ENCOUNTER — Other Ambulatory Visit: Payer: Self-pay

## 2016-08-01 ENCOUNTER — Other Ambulatory Visit: Payer: Self-pay

## 2016-08-11 ENCOUNTER — Encounter (HOSPITAL_COMMUNITY): Payer: Self-pay | Admitting: Certified Nurse Midwife

## 2016-08-11 ENCOUNTER — Inpatient Hospital Stay (HOSPITAL_COMMUNITY)
Admission: AD | Admit: 2016-08-11 | Discharge: 2016-08-22 | DRG: 775 | Disposition: A | Discharge status: Home / Self Care | Payer: Medicaid Other | Source: Ambulatory Visit | Attending: Family Medicine | Admitting: Family Medicine

## 2016-08-11 DIAGNOSIS — O42113 Preterm premature rupture of membranes, onset of labor more than 24 hours following rupture, third trimester: Secondary | ICD-10-CM | POA: Diagnosis not present

## 2016-08-11 DIAGNOSIS — F1210 Cannabis abuse, uncomplicated: Secondary | ICD-10-CM | POA: Diagnosis present

## 2016-08-11 DIAGNOSIS — O42913 Preterm premature rupture of membranes, unspecified as to length of time between rupture and onset of labor, third trimester: Secondary | ICD-10-CM | POA: Diagnosis not present

## 2016-08-11 DIAGNOSIS — Z3A30 30 weeks gestation of pregnancy: Secondary | ICD-10-CM | POA: Diagnosis not present

## 2016-08-11 DIAGNOSIS — O09219 Supervision of pregnancy with history of pre-term labor, unspecified trimester: Secondary | ICD-10-CM

## 2016-08-11 DIAGNOSIS — Z3A29 29 weeks gestation of pregnancy: Secondary | ICD-10-CM

## 2016-08-11 DIAGNOSIS — Z8249 Family history of ischemic heart disease and other diseases of the circulatory system: Secondary | ICD-10-CM

## 2016-08-11 DIAGNOSIS — O0990 Supervision of high risk pregnancy, unspecified, unspecified trimester: Secondary | ICD-10-CM

## 2016-08-11 DIAGNOSIS — Z833 Family history of diabetes mellitus: Secondary | ICD-10-CM

## 2016-08-11 DIAGNOSIS — O99324 Drug use complicating childbirth: Secondary | ICD-10-CM | POA: Diagnosis present

## 2016-08-11 DIAGNOSIS — O99320 Drug use complicating pregnancy, unspecified trimester: Secondary | ICD-10-CM | POA: Diagnosis present

## 2016-08-11 DIAGNOSIS — O42013 Preterm premature rupture of membranes, onset of labor within 24 hours of rupture, third trimester: Principal | ICD-10-CM | POA: Diagnosis present

## 2016-08-11 DIAGNOSIS — O99323 Drug use complicating pregnancy, third trimester: Secondary | ICD-10-CM | POA: Diagnosis not present

## 2016-08-11 DIAGNOSIS — O411230 Chorioamnionitis, third trimester, not applicable or unspecified: Secondary | ICD-10-CM | POA: Diagnosis present

## 2016-08-11 DIAGNOSIS — O42919 Preterm premature rupture of membranes, unspecified as to length of time between rupture and onset of labor, unspecified trimester: Secondary | ICD-10-CM | POA: Diagnosis present

## 2016-08-11 DIAGNOSIS — Z87891 Personal history of nicotine dependence: Secondary | ICD-10-CM

## 2016-08-11 LAB — URINALYSIS, ROUTINE W REFLEX MICROSCOPIC
Bilirubin Urine: NEGATIVE
GLUCOSE, UA: NEGATIVE mg/dL
Hgb urine dipstick: NEGATIVE
Ketones, ur: NEGATIVE mg/dL
LEUKOCYTES UA: NEGATIVE
Nitrite: NEGATIVE
PH: 6 (ref 5.0–8.0)
Protein, ur: NEGATIVE mg/dL

## 2016-08-11 LAB — AMNISURE RUPTURE OF MEMBRANE (ROM) NOT AT ARMC: AMNISURE: POSITIVE

## 2016-08-11 LAB — RAPID URINE DRUG SCREEN, HOSP PERFORMED
Amphetamines: NOT DETECTED
Barbiturates: NOT DETECTED
Benzodiazepines: NOT DETECTED
COCAINE: NOT DETECTED
OPIATES: NOT DETECTED
TETRAHYDROCANNABINOL: POSITIVE — AB

## 2016-08-11 LAB — TYPE AND SCREEN
ABO/RH(D): O POS
Antibody Screen: NEGATIVE

## 2016-08-11 LAB — ABO/RH: ABO/RH(D): O POS

## 2016-08-11 LAB — CBC
HEMATOCRIT: 30 % — AB (ref 36.0–46.0)
Hemoglobin: 10.5 g/dL — ABNORMAL LOW (ref 12.0–15.0)
MCH: 28.5 pg (ref 26.0–34.0)
MCHC: 35 g/dL (ref 30.0–36.0)
MCV: 81.5 fL (ref 78.0–100.0)
Platelets: 247 10*3/uL (ref 150–400)
RBC: 3.68 MIL/uL — ABNORMAL LOW (ref 3.87–5.11)
RDW: 14.5 % (ref 11.5–15.5)
WBC: 11.9 10*3/uL — AB (ref 4.0–10.5)

## 2016-08-11 MED ORDER — DOCUSATE SODIUM 100 MG PO CAPS
100.0000 mg | ORAL_CAPSULE | Freq: Every day | ORAL | Status: DC
  Administered 2016-08-12 – 2016-08-19 (×7): 100 mg via ORAL
  Filled 2016-08-11 (×8): qty 1

## 2016-08-11 MED ORDER — ZOLPIDEM TARTRATE 5 MG PO TABS
5.0000 mg | ORAL_TABLET | Freq: Every evening | ORAL | Status: DC | PRN
  Administered 2016-08-15 – 2016-08-18 (×2): 5 mg via ORAL
  Filled 2016-08-11 (×2): qty 1

## 2016-08-11 MED ORDER — PRENATAL MULTIVITAMIN CH
1.0000 | ORAL_TABLET | Freq: Every day | ORAL | Status: DC
  Administered 2016-08-12 – 2016-08-19 (×8): 1 via ORAL
  Filled 2016-08-11 (×8): qty 1

## 2016-08-11 MED ORDER — AMOXICILLIN 500 MG PO CAPS
500.0000 mg | ORAL_CAPSULE | Freq: Three times a day (TID) | ORAL | Status: AC
  Administered 2016-08-13 – 2016-08-18 (×15): 500 mg via ORAL
  Filled 2016-08-11 (×15): qty 1

## 2016-08-11 MED ORDER — AZITHROMYCIN 250 MG PO TABS
500.0000 mg | ORAL_TABLET | Freq: Every day | ORAL | Status: AC
  Administered 2016-08-13 – 2016-08-17 (×5): 500 mg via ORAL
  Filled 2016-08-11 (×5): qty 2

## 2016-08-11 MED ORDER — ACETAMINOPHEN 325 MG PO TABS
650.0000 mg | ORAL_TABLET | ORAL | Status: DC | PRN
  Administered 2016-08-15 – 2016-08-20 (×4): 650 mg via ORAL
  Filled 2016-08-11 (×4): qty 2

## 2016-08-11 MED ORDER — SODIUM CHLORIDE 0.9 % IV SOLN
2.0000 g | Freq: Four times a day (QID) | INTRAVENOUS | Status: AC
  Administered 2016-08-11 – 2016-08-13 (×8): 2 g via INTRAVENOUS
  Filled 2016-08-11 (×8): qty 2000

## 2016-08-11 MED ORDER — BETAMETHASONE SOD PHOS & ACET 6 (3-3) MG/ML IJ SUSP
12.0000 mg | INTRAMUSCULAR | Status: AC
  Administered 2016-08-11 – 2016-08-12 (×2): 12 mg via INTRAMUSCULAR
  Filled 2016-08-11 (×2): qty 2

## 2016-08-11 MED ORDER — CALCIUM CARBONATE ANTACID 500 MG PO CHEW: 2.0000 | CHEWABLE_TABLET | ORAL | Status: DC | PRN

## 2016-08-11 MED ORDER — DEXTROSE 5 % IV SOLN
500.0000 mg | INTRAVENOUS | Status: AC
  Administered 2016-08-11 – 2016-08-12 (×2): 500 mg via INTRAVENOUS
  Filled 2016-08-11 (×2): qty 500

## 2016-08-11 MED ORDER — LACTATED RINGERS IV SOLN
INTRAVENOUS | Status: DC
  Administered 2016-08-11 – 2016-08-13 (×3): via INTRAVENOUS

## 2016-08-11 NOTE — MAU Note (Signed)
Pt states her water broke at 3AM. Pt denies ctxs but states she feels pelvic pressure. Fetus is active. Pt denies bleeding.

## 2016-08-11 NOTE — MAU Note (Signed)
[facility]. Pt states that her last Makenna injection was "2 or 3 weeks ago". Pt states yes when asked about pelvic rest. Sperm seen on fern slide. RN questioned pt about pelvic rest and pt states "oh is that what that means?". Pt then states she is having intercourse. Pt was seen in MAU on 06/09/16 and was given BMZ but was a no show for 2nd dose.

## 2016-08-11 NOTE — MAU Provider Note (Signed)
Patty Espinoza Patty Espinoza is a 25 y.o. female @ 29.6wks [phone] presenting for PPROM @ 0300. Denies any VB, +FM. OB History    Gravida Para Term Preterm AB Living   5 3 0 3 1 3    SAB TAB Ectopic Multiple Live Births   0 1 0 0 3     Past Medical History:  Diagnosis Date  . Medical history non-contributory    Past Surgical History:  Procedure Laterality Date  . NO PAST SURGERIES     Family History: family history includes Alcohol abuse in her maternal grandfather; Arthritis in her maternal grandfather; COPD in her maternal grandfather; Diabetes in her father, maternal grandfather, and maternal grandmother; Hyperlipidemia in her maternal grandfather; Hypertension in her father, maternal grandfather, maternal grandmother, and mother; Vision loss in her maternal grandfather. Social History:  reports that she quit smoking about 6 months ago. Her smoking use included Cigarettes. She has a 3.00 pack-year smoking history. She has never used smokeless tobacco. She reports that she does not drink alcohol or use drugs.     Maternal Diabetes: No Genetic Screening: Normal Maternal Ultrasounds/Referrals: Normal Fetal Ultrasounds or other Referrals:  None Maternal Substance Abuse:  Yes:  Type: Marijuana Significant Maternal Medications:  None Significant Maternal Lab Results:  None Other Comments:  Pt has not been to PN appt's in last few weeks to receive Makena injection   Review of Systems  Constitutional: Negative.   HENT: Negative.   Eyes: Negative.   Respiratory: Negative.   Cardiovascular: Negative.   Gastrointestinal: Negative.   Genitourinary: Negative.   Musculoskeletal: Negative.   Skin: Negative.   Neurological: Negative.   Endo/Heme/Allergies: Negative.   Psychiatric/Behavioral: Negative.    Maternal Medical History:  Reason for admission: Rupture of membranes.   Contractions: Onset was 6-12 hours ago.    Fetal activity: Perceived fetal activity is normal.   Last perceived fetal  movement was within the past hour.    Prenatal complications: Preterm labor and substance abuse.   Prenatal Complications - Diabetes: none.      Blood pressure 124/68, pulse 65, temperature 97.6 F (36.4 C), temperature source Oral, resp. rate 18, last menstrual period 12/14/2015. Maternal Exam:  Abdomen: Patient reports no abdominal tenderness. Estimated fetal weight is 3-0.   Fetal presentation: vertex  Introitus: Normal vulva. Vagina is positive for vaginal discharge.  Ferning test: not done.  Nitrazine test: not done. Amniotic fluid character: clear.  Pelvis: adequate for delivery.   Cervix: Cervix evaluated by sterile speculum exam.     Fetal Exam Fetal Monitor Review: Mode: ultrasound.   Variability: moderate (6-25 bpm).   Pattern: accelerations present and no decelerations.    Fetal State Assessment: Category I - tracings are normal.     Physical Exam  Constitutional: She is oriented to person, place, and time. She appears well-developed and well-nourished.  HENT:  Head: Normocephalic and atraumatic.  Eyes: Conjunctivae are normal. Pupils are equal, round, and reactive to light.  Neck: Normal range of motion. Neck supple.  Cardiovascular: Normal rate and regular rhythm.   Respiratory: Effort normal and breath sounds normal.  GI: Soft. Bowel sounds are normal.  Genitourinary: Vaginal discharge found.  Genitourinary Comments: Pooling noted upon speculum exam   Musculoskeletal: Normal range of motion.  Neurological: She is alert and oriented to person, place, and time.  Skin: Skin is warm and dry.  Psychiatric: She has a normal mood and affect. Her behavior is normal. Judgment and thought content normal.    Prenatal  labs: ABO, Rh: O/Positive/-- (08/28 1447) Antibody: Negative (08/28 1447) Rubella: 2.74 (08/28 1447) RPR: Non Reactive (08/28 1447)  HBsAg: Negative (08/28 1447)  HIV: Non Reactive (08/28 1447)  GBS:     Assessment/Plan: Preg @ 29.6 wks,  [id number], PPROM since 0300, amnisure +; U/S confirms vertex presentation; pt seen @ [facility] on 10/26- SVE 2/50/-2; Dr. Erin FullingHarraway-Smith consulted, reviewed POC;  Admit to Ante, will start IVF's, antibiotics, and admin steroids    Patty Espinoza 08/11/2016, 3:58 PM

## 2016-08-11 NOTE — H&P (Signed)
Patty Espinoza is a 25 y.o. female @ 29.6wks [phone] presenting for PPROM @ 0300. Denies any VB, +FM.         OB History    Gravida Para Term Preterm AB Living   5 3 0 3 1 3    SAB TAB Ectopic Multiple Live Births   0 1 0 0 3         Past Medical History:  Diagnosis Date  . Medical history non-contributory         Past Surgical History:  Procedure Laterality Date  . NO PAST SURGERIES     Family History: family history includes Alcohol abuse in her maternal grandfather; Arthritis in her maternal grandfather; COPD in her maternal grandfather; Diabetes in her father, maternal grandfather, and maternal grandmother; Hyperlipidemia in her maternal grandfather; Hypertension in her father, maternal grandfather, maternal grandmother, and mother; Vision loss in her maternal grandfather. Social History:  reports that she quit smoking about 6 months ago. Her smoking use included Cigarettes. She has a 3.00 pack-year smoking history. She has never used smokeless tobacco. She reports that she does not drink alcohol or use drugs.     Maternal Diabetes: No Genetic Screening: Normal Maternal Ultrasounds/Referrals: Normal Fetal Ultrasounds or other Referrals:  None Maternal Substance Abuse:  Yes:  Type: Marijuana Significant Maternal Medications:  None Significant Maternal Lab Results:  None Other Comments:  Pt has not been to PN appt's in last few weeks to receive Makena injection   Review of Systems  Constitutional: Negative.   HENT: Negative.   Eyes: Negative.   Respiratory: Negative.   Cardiovascular: Negative.   Gastrointestinal: Negative.   Genitourinary: Negative.   Musculoskeletal: Negative.   Skin: Negative.   Neurological: Negative.   Endo/Heme/Allergies: Negative.   Psychiatric/Behavioral: Negative.    Maternal Medical History:  Reason for admission: Rupture of membranes.   Contractions: Onset was 6-12 hours ago.    Fetal activity: Perceived fetal activity is  normal.   Last perceived fetal movement was within the past hour.    Prenatal complications: Preterm labor and substance abuse.   Prenatal Complications - Diabetes: none.     Blood pressure 124/68, pulse 65, temperature 97.6 F (36.4 C), temperature source Oral, resp. rate 18, last menstrual period 12/14/2015. Maternal Exam:  Abdomen: Patient reports no abdominal tenderness. Estimated fetal weight is 3-0.   Fetal presentation: vertex  Introitus: Normal vulva. Vagina is positive for vaginal discharge.  Ferning test: not done.  Nitrazine test: not done. Amniotic fluid character: clear.  Pelvis: adequate for delivery.   Cervix: Cervix evaluated by sterile speculum exam.     Fetal Exam Fetal Monitor Review: Mode: ultrasound.   Variability: moderate (6-25 bpm).   Pattern: accelerations present and no decelerations.    Fetal State Assessment: Category I - tracings are normal.    Physical Exam  Constitutional: She is oriented to person, place, and time. She appears well-developed and well-nourished.  HENT:  Head: Normocephalic and atraumatic.  Eyes: Conjunctivae are normal. Pupils are equal, round, and reactive to light.  Neck: Normal range of motion. Neck supple.  Cardiovascular: Normal rate and regular rhythm.   Respiratory: Effort normal and breath sounds normal.  GI: Soft. Bowel sounds are normal.  Genitourinary: Vaginal discharge found.  Genitourinary Comments: Pooling noted upon speculum exam   Musculoskeletal: Normal range of motion.  Neurological: She is alert and oriented to person, place, and time.  Skin: Skin is warm and dry.  Psychiatric: She has a normal mood  and affect. Her behavior is normal. Judgment and thought content normal.    Prenatal labs: ABO, Rh: O/Positive/-- (08/28 1447) Antibody: Negative (08/28 1447) Rubella: 2.74 (08/28 1447) RPR: Non Reactive (08/28 1447)  HBsAg: Negative (08/28 1447)  HIV: Non Reactive (08/28 1447)  GBS:      Assessment/Plan: Preg @ 29.6 wks, [id number], PPROM since 0300, amnisure +; U/S confirms vertex presentation; pt seen @ [facility] on 10/26- SVE 2/50/-2; Admit to Ante, will start IVF's, antibiotics, and admin steroids     NICU consult obtained.   Bastian Andreoli L. Harraway-Smith, M.D., [facility]

## 2016-08-11 NOTE — Consult Note (Signed)
   Asked by Dr Erin FullingHarraway-Smith to consult on  Ms Patty Espinoza to discuss outcome of preterm baby at 2729 weeks.   Chart reviewed. Ms Patty Espinoza is 3829 6/7 weeks, late to care with irregular follow up visits.  SROM  at 3 a.m. today, without contractions. Other complications during pregnancy include: Marijuana use and smoking. She has had previous preterm deliveries at 36 weeks x 2 and at 29 weeks. She states the babies are doing well. She has received a dose of betamethasone today. Currently on Amp/Zithromax.  I discussed our delivery team's presence at delivery and our assessment of the baby's  respiratory condition. I discussed intubation/resuscitation.  As she has had a previous 29 wk preterm infant, I reviewed the common complications of prematurity such as  RDS, GI immaturity, and poor immune response. I also talked about risk of IVH and ROP and screening exams for both. I also talked about  about HAL, umbilical lines, and delayed feeding.   I discussed breast feeding and benefits especially to a preterm baby.   She was attentive through our discussion and did not have any questions.  I spent 25 minutes with this consult, more than 50% of the time was with face-to-face counseling with Ms Patty Espinoza.   Lucillie Garfinkelita Q Yaritzel Stange, MD  Neonatologist

## 2016-08-12 DIAGNOSIS — O42913 Preterm premature rupture of membranes, unspecified as to length of time between rupture and onset of labor, third trimester: Secondary | ICD-10-CM

## 2016-08-12 DIAGNOSIS — O99320 Drug use complicating pregnancy, unspecified trimester: Secondary | ICD-10-CM | POA: Diagnosis present

## 2016-08-12 DIAGNOSIS — O99323 Drug use complicating pregnancy, third trimester: Secondary | ICD-10-CM

## 2016-08-12 DIAGNOSIS — Z3A30 30 weeks gestation of pregnancy: Secondary | ICD-10-CM

## 2016-08-12 MED ORDER — ONDANSETRON HCL 4 MG/2ML IJ SOLN
4.0000 mg | Freq: Four times a day (QID) | INTRAMUSCULAR | Status: DC | PRN
  Administered 2016-08-12: 4 mg via INTRAVENOUS
  Filled 2016-08-12: qty 2

## 2016-08-12 NOTE — Progress Notes (Addendum)
Patient ID: Patty Espinoza, female   DOB: 1990-12-29, 25 y.o.   MRN: [id number] ACULTY PRACTICE ANTEPARTUM COMPREHENSIVE PROGRESS NOTE  Patty Espinoza is a 25 y.o. [id number] at 5959w0d  who is admitted for PROM.   Fetal presentation is cephalic. Length of Stay:  1  Days  Subjective: Pt reports continued leaking of fluid.   Patient reports good fetal movement.  She reports no uterine contractions and no bleeding.  Vitals:  Blood pressure (!) 97/45, pulse 70, temperature 98.7 F (37.1 C), temperature source Oral, resp. rate 16, height 5\' 2"  (1.575 m), weight 140 lb (63.5 kg), last menstrual period 12/14/2015. Physical Examination: General appearance - alert, well appearing, and in no distress Abdomen - soft, nontender, nondistended, no masses or organomegaly Extremities - peripheral pulses normal, no pedal edema, no clubbing or cyanosis Cervical Exam: Not evaluated.  Membranes:ruptured; clear fluid   Fetal Monitoring:  Baseline: 120's bpm, Variability: Good {> 6 bpm), Accelerations: Reactive, Decelerations: Absent and Toco: no ctx  Labs:  Results for orders placed or performed during the hospital encounter of 08/11/16 (from the past 24 hour(s))  Urine rapid drug screen (hosp performed)   Collection Time: 08/11/16  2:47 PM  Result Value Ref Range   Opiates NONE DETECTED NONE DETECTED   Cocaine NONE DETECTED NONE DETECTED   Benzodiazepines NONE DETECTED NONE DETECTED   Amphetamines NONE DETECTED NONE DETECTED   Tetrahydrocannabinol POSITIVE (A) NONE DETECTED   Barbiturates NONE DETECTED NONE DETECTED  Urinalysis, Routine w reflex microscopic (not at [facility])   Collection Time: 08/11/16  2:47 PM  Result Value Ref Range   Color, Urine YELLOW YELLOW   APPearance CLEAR CLEAR   Specific Gravity, Urine <1.005 (L) 1.005 - 1.030   pH 6.0 5.0 - 8.0   Glucose, UA NEGATIVE NEGATIVE mg/dL   Hgb urine dipstick NEGATIVE NEGATIVE   Bilirubin Urine NEGATIVE NEGATIVE   Ketones, ur NEGATIVE NEGATIVE mg/dL    Protein, ur NEGATIVE NEGATIVE mg/dL   Nitrite NEGATIVE NEGATIVE   Leukocytes, UA NEGATIVE NEGATIVE  Amnisure rupture of membrane (rom)not at [facility]   Collection Time: 08/11/16  2:59 PM  Result Value Ref Range   Amnisure ROM POSITIVE   CBC   Collection Time: 08/11/16  3:45 PM  Result Value Ref Range   WBC 11.9 (H) 4.0 - 10.5 K/uL   RBC 3.68 (L) 3.87 - 5.11 MIL/uL   Hemoglobin 10.5 (L) 12.0 - 15.0 g/dL   HCT 13.030.0 (L) 86.536.0 - 78.446.0 %   MCV 81.5 78.0 - 100.0 fL   MCH 28.5 26.0 - 34.0 pg   MCHC 35.0 30.0 - 36.0 g/dL   RDW 69.614.5 29.511.5 - 28.415.5 %   Platelets 247 150 - 400 K/uL  Type and screen [facility]   Collection Time: 08/11/16  3:45 PM  Result Value Ref Range   ABO/RH(D) O POS    Antibody Screen NEG    Sample Expiration 08/14/2016   ABO/Rh   Collection Time: 08/11/16  3:45 PM  Result Value Ref Range   ABO/RH(D) O POS     Imaging Studies:    None pending   Medications:  Scheduled . ampicillin (OMNIPEN) IV  2 g Intravenous Q6H   Followed by  . [START ON 08/13/2016] amoxicillin  500 mg Oral Q8H  . azithromycin  500 mg Intravenous Q24H   Followed by  . [START ON 08/13/2016] azithromycin  500 mg Oral Daily  . betamethasone acetate-betamethasone sodium phosphate  12 mg Intramuscular Q24H  .  docusate sodium  100 mg Oral Daily  . prenatal multivitamin  1 tablet Oral Q1200   I have reviewed the patient's current medications.  ASSESSMENT: Patient Active Problem List   Diagnosis Date Noted  . Preterm premature rupture of membranes (PPROM) with unknown onset of labor 08/11/2016  . Fetal cardiac echogenic focus 06/12/2016  . Supervision of high risk pregnancy, antepartum 06/10/2016  . High risk pregnancy due to history of preterm labor, antepartum 06/10/2016    PLAN: Cont atbx for latency Deliver at 34 weeks or sooner for maternal or fetal indications  Pt s/p BMZ x1 F/u cx Begin magnesium sulfate if pt begins to labor prior to 32 weeks S/p NICU  consult Continue routine antenatal care.   HARRAWAY-SMITH, Renleigh Ouellet 08/12/2016,6:33 AM

## 2016-08-13 ENCOUNTER — Inpatient Hospital Stay (HOSPITAL_COMMUNITY): Payer: Medicaid Other

## 2016-08-13 DIAGNOSIS — Z3A29 29 weeks gestation of pregnancy: Secondary | ICD-10-CM

## 2016-08-13 NOTE — Progress Notes (Addendum)
Patient ID: Patty Espinoza, female   DOB: [birth year], 25 y.o.   MRN: [id number] ACULTY PRACTICE ANTEPARTUM COMPREHENSIVE PROGRESS NOTE  Patty Espinoza is a 25 y.o. [id number] at 3675w0d  who is admitted for PROM.   Fetal presentation is cephalic. Length of Stay:  2  Days  Subjective: Pt reports continued leaking of clear fluid. No abdominal pain, fevers, chills, contractions. No GERD symptoms. Patient reports good fetal movement.  She reports no uterine contractions and no bleeding.  Vitals:  Blood pressure (!) 84/50, pulse 65, temperature 98.3 F (36.8 C), temperature source Oral, resp. rate 16, height 5\' 2"  (1.575 m), weight 140 lb (63.5 kg), last menstrual period 12/14/2015. Physical Examination: General appearance - alert, well appearing, and in no distress Abdomen - soft, nontender, nondistended, no masses or organomegaly Extremities - peripheral pulses normal, no pedal edema, no clubbing or cyanosis Cervical Exam: Not evaluated.  Membranes:ruptured; clear fluid   Fetal Monitoring:  Baseline: 120's bpm, Variability: Good {> 6 bpm), Accelerations: Reactive, Decelerations: Absent and Toco: no ctx x3  Labs:  No results found for this or any previous visit (from the past 24 hour(s)).  Imaging Studies:    None pending   Medications:  Scheduled . ampicillin (OMNIPEN) IV  2 g Intravenous Q6H   Followed by  . amoxicillin  500 mg Oral Q8H  . azithromycin  500 mg Oral Daily  . docusate sodium  100 mg Oral Daily  . prenatal multivitamin  1 tablet Oral Q1200   I have reviewed the patient's current medications.  ASSESSMENT: Patient Active Problem List   Diagnosis Date Noted  . Drug use affecting pregnancy, antepartum 08/12/2016  . PROM (premature rupture of membranes) 08/12/2016  . Preterm premature rupture of membranes (PPROM) with unknown onset of labor 08/11/2016  . Fetal cardiac echogenic focus 06/12/2016  . Supervision of high risk pregnancy, antepartum 06/10/2016  . High risk  pregnancy due to history of preterm labor, antepartum 06/10/2016    PLAN: 1. PPROM  Cont atbx for latency  S/p BMZ x2  Deliver at 34 weeks or sooner for maternal or fetal indications   GC/CT by urine today (not sent in MAU)\  GBS today  Begin magnesium sulfate if pt begins to labor prior to 32 weeks  US for growth. 2. [redacted] weeks gestation  Needs 1 hr GTT in about 1 week - had BMZ on 10/29 & 10/30.  Continue routine antenatal care.   STINSON, JACOB JEHIEL 08/13/2016,7:04 AM

## 2016-08-14 LAB — TYPE AND SCREEN
ABO/RH(D): O POS
ANTIBODY SCREEN: NEGATIVE

## 2016-08-14 LAB — CULTURE, BETA STREP (GROUP B ONLY)

## 2016-08-14 NOTE — Progress Notes (Signed)
Patient ID: Patty Espinoza, female   DOB: Dec 21, 1990, 25 y.o.   MRN: [id number] FACULTY PRACTICE ANTEPARTUM(COMPREHENSIVE) NOTE  Patty Routemani Radney is a 25 y.o. [id number] at 3047w2d by early ultrasound who is admitted for PROM.   Fetal presentation is cephalic. Length of Stay:  3  Days  Subjective: Still leaking fluid. Patient reports the fetal movement as active. Patient reports uterine contraction  activity as none. Patient reports  vaginal bleeding as none. Patient describes fluid per vagina as None.  Vitals:  Blood pressure (!) 130/59, pulse 88, temperature 98 F (36.7 C), temperature source Oral, resp. rate 16, height 5\' 2"  (1.575 m), weight 140 lb (63.5 kg), last menstrual period 12/14/2015. Physical Examination:  General appearance - alert, well appearing, and in no distress Chest - normal effort Abdomen - gravid, NT Fundal Height:  size equals dates Extremities: Homans sign is negative, no sign of DVT  Membranes:ruptured, clear fluid  Fetal Monitoring:  Baseline: 140 bpm, Variability: Good {> 6 bpm), Accelerations: Reactive and Decelerations: Absent  U/S yesterday shows 52% 2 lb 11 oz, vtx, mildly decreased fluid with AFI of 10---dating changed to 28 2/7 wks  Medications:  Scheduled . amoxicillin  500 mg Oral Q8H  . azithromycin  500 mg Oral Daily  . docusate sodium  100 mg Oral Daily  . prenatal multivitamin  1 tablet Oral Q1200   I have reviewed the patient's current medications.  ASSESSMENT: Active Problems:   Preterm premature rupture of membranes (PPROM) with unknown onset of labor   Drug use affecting pregnancy, antepartum   PROM (premature rupture of membranes)   PLAN: Continue latency Abx Inpatient care until delivery Change EDC   Reva Boresanya S Levander Katzenstein, MD 08/14/2016,7:03 AM

## 2016-08-15 NOTE — Progress Notes (Signed)
Patient ID: Shawn Routemani Heimsoth, female   DOB: Aug 29, 1991, 25 y.o.   MRN: [id number] FACULTY PRACTICE ANTEPARTUM(COMPREHENSIVE) NOTE  Shawn Routemani Kail is a 25 y.o. [id number] at 3677w6d  by early ultrasound who is admitted for PROM.   Fetal presentation is cephalic. Length of Stay:  4  Days  Subjective: Still leaking fluid. Patient reports the fetal movement as active. Patient reports uterine contraction  activity as none. Patient reports  vaginal bleeding as none. Patient describes fluid per vagina as None.  Vitals:  Blood pressure 117/71, pulse 73, temperature 98 F (36.7 C), temperature source Oral, resp. rate 14, height 5\' 2"  (1.575 m), weight 139 lb 4.8 oz (63.2 kg), last menstrual period 12/14/2015. Physical Examination:  General appearance - alert, well appearing, and in no distress Chest - normal effort Abdomen - gravid, NT Fundal Height:  size equals dates Extremities: Homans sign is negative, no sign of DVT  Membranes:ruptured, clear fluid  Fetal Monitoring:  Baseline: 140 bpm, Variability: Good {> 6 bpm), Accelerations: Reactive and Decelerations: Absent  U/S  shows 52% 2 lb 11 oz, vtx, mildly decreased fluid with AFI of 10---dating changed to 28 2/7 wks  Medications:  Scheduled . amoxicillin  500 mg Oral Q8H  . azithromycin  500 mg Oral Daily  . docusate sodium  100 mg Oral Daily  . prenatal multivitamin  1 tablet Oral Q1200   I have reviewed the patient's current medications.  ASSESSMENT: Active Problems:   Preterm premature rupture of membranes (PPROM) with unknown onset of labor   Drug use affecting pregnancy, antepartum   PROM (premature rupture of membranes)   PLAN: Continue latency Abx Inpatient care [facility]   Lazaro ArmsEURE,LUTHER H, MD 08/15/2016,9:09 AMPatient ID: Shawn RouteImani Bourassa, female   DOB: Aug 29, 1991, 25 y.o.   MRN: [id number]

## 2016-08-16 NOTE — Progress Notes (Signed)
Patient ID: Patty Routemani Haymaker, female   DOB: November 30, 1990, 25 y.o.   MRN: [id number] FACULTY PRACTICE ANTEPARTUM(COMPREHENSIVE) NOTE  Patty Espinoza is a 25 y.o. [id number] at 5765w5d, redated by 28 week ultrasound not c/w LMP, who is admitted for PPROM.   Fetal presentation is cephalic. Length of Stay:  5  Days  Subjective: Still leaking fluid. No other complaints. Patient reports the fetal movement as active. Patient reports uterine contraction  activity as none. Patient reports  vaginal bleeding as none. Patient describes fluid per vagina as None.  Vitals:  Blood pressure (!) 109/43, pulse 61, temperature 98.6 F (37 C), temperature source Oral, resp. rate 16, height 5\' 2"  (1.575 m), weight 139 lb 4.8 oz (63.2 kg), last menstrual period 12/14/2015. Physical Examination: General appearance - alert, well appearing, and in no distress Chest - normal effort Abdomen - gravid, NT Fundal Height:  size equals dates Extremities: Homans sign is negative, no sign of DVT  Membranes:ruptured, clear fluid  Fetal Monitoring:  Baseline: 140 bpm, Variability: Good {> 6 bpm), Accelerations: Reactive and Decelerations: Absent  Imaging: 08/13/16 U/S  shows 52% 2 lb 11 oz, vtx, mildly decreased fluid with AFI of 10---dating changed to EDC of 11/03/16  Medications:  Scheduled . amoxicillin  500 mg Oral Q8H  . azithromycin  500 mg Oral Daily  . docusate sodium  100 mg Oral Daily  . prenatal multivitamin  1 tablet Oral Q1200   I have reviewed the patient's current medications.  ASSESSMENT: Principal Problem:   Preterm premature rupture of membranes (PPROM) with unknown onset of labor Active Problems:   Supervision of high risk pregnancy, antepartum   High risk pregnancy due to history of preterm labor, antepartum   Drug use affecting pregnancy, antepartum   PLAN: Continue latency antibiotics, s/p betamethasone Inpatient care until delivery Delivery indicated for chorioamnionitis or other maternal-fetal  indications Reassuring FWB, continue NST q shift Continue routine antenatal care    Analilia Geddis A, MD 08/16/2016,5:48 AM

## 2016-08-17 LAB — TYPE AND SCREEN
ABO/RH(D): O POS
ANTIBODY SCREEN: NEGATIVE

## 2016-08-17 MED ORDER — SODIUM CHLORIDE 0.9% FLUSH
3.0000 mL | Freq: Two times a day (BID) | INTRAVENOUS | Status: DC
  Administered 2016-08-17 – 2016-08-19 (×5): 3 mL via INTRAVENOUS

## 2016-08-17 NOTE — Progress Notes (Addendum)
Daily Antepartum Note  Admission Date: 08/11/2016 Current Date: 08/17/2016 10:01 AM  Patty Espinoza is a 25 y.o. [id number] @ 4748w6d, HD#7, admitted for PPROM on 10/29.  Pregnancy complicated by: Patient Active Problem List   Diagnosis Date Noted  . Drug use affecting pregnancy, antepartum 08/12/2016  . Preterm premature rupture of membranes (PPROM) with unknown onset of labor 08/11/2016  . Fetal cardiac echogenic focus 06/12/2016  . Supervision of high risk pregnancy, antepartum 06/10/2016  . High risk pregnancy due to history of preterm labor, antepartum 06/10/2016    Overnight/24hr events:  None  Subjective:  No s/s of decreased FM, PTL or VB and no fevers, chills, belly pains.   Objective:    Current Vital Signs 24h Vital Sign Ranges  T 98.5 F (36.9 C) Temp  Avg: 98.7 F (37.1 C)  Min: 98.5 F (36.9 C)  Max: 99.2 F (37.3 C)  BP (!) 110/49 BP  Min: 104/50  Max: 125/73  HR (!) 56 Pulse  Avg: 65.6  Min: 54  Max: 81  RR 16 Resp  Avg: 17.7  Min: 16  Max: 20  SaO2 100 % Not Delivered SpO2  Avg: 100 %  Min: 100 %  Max: 100 %       24 Hour I/O Current Shift I/O  Time Ins Outs No intake/output data recorded. No intake/output data recorded.   Physical exam: General: Well nourished, well developed female in no acute distress. Abdomen: gravid, nttp Cardiovascular: S1, S2 normal, no murmur, rub or gallop, regular rate and rhythm Respiratory: CTAB Extremities: no clubbing, cyanosis or edema Skin: Warm and dry.   Medications: Current Facility-Administered Medications  Medication Dose Route Frequency Provider Last Rate Last Dose  . acetaminophen (TYLENOL) tablet 650 mg  650 mg Oral Q4H PRN Montez MoritaMarie D Lawson, CNM   650 mg at 08/15/16 2140  . amoxicillin (AMOXIL) capsule 500 mg  500 mg Oral Q8H Montez MoritaMarie D Lawson, CNM   500 mg at 08/17/16 [id number]  . azithromycin [facility](ZITHROMAX) tablet 500 mg  500 mg Oral Daily Montez MoritaMarie D Lawson, CNM   500 mg at 08/16/16 [id number]  . calcium carbonate (TUMS - dosed in  mg elemental calcium) chewable tablet 400 mg of elemental calcium  2 tablet Oral Q4H PRN Montez MoritaMarie D Lawson, CNM      . docusate sodium (COLACE) capsule 100 mg  100 mg Oral Daily Montez MoritaMarie D Lawson, CNM   100 mg at 08/15/16 1029  . lactated ringers infusion   Intravenous Continuous Montez MoritaMarie D Lawson, CNM   Stopped at 08/13/16 2137  . ondansetron (ZOFRAN) injection 4 mg  4 mg Intravenous Q6H PRN Rhona RaiderJacob J Stinson, DO   4 mg at 08/12/16 1806  . prenatal multivitamin tablet 1 tablet  1 tablet Oral Q1200 Montez MoritaMarie D Lawson, CNM   1 tablet at 08/16/16 [id number]  . zolpidem (AMBIEN) tablet 5 mg  5 mg Oral QHS PRN Montez MoritaMarie D Lawson, CNM   5 mg at 08/15/16 2248    Labs:   Recent Labs Lab 08/11/16 1545  WBC 11.9*  HGB 10.5*  HCT 30.0*  PLT 247   O POS  Radiology: no new imaging.   Assessment & Plan:  Pt doing well *IUP: fetal status reassuring. Continue with daily NSTs. Doesn't desire BTL *PPROM: D7/7 of latency abx. S/p bmz course on admission. Continue to follow closely -10/31: 52%, 1210g, no AC, cephalic. EDC changed to current one -10/26: 2/50/-2 @ [facility] *Preterm: see above. S/p nicu c/s already.  *GBS  pos: tx in labor *h/o THC use: screen again PRN *PPx: SCDs, OOB ad lib.  *Dispo: delivery at 34wks or any s/s of maternal/fetal distress  Patty Espinoza, Jr. MD Attending Center for [facility](Faculty Practice)

## 2016-08-18 NOTE — Progress Notes (Signed)
Daily Antepartum Note  Admission Date: 08/11/2016 Current Date: 08/18/2016 6:58 AM  Patty Espinoza is a 25 y.o. [id number] @ 3281w6d, HD#8, admitted for PPROM on 10/29.  Pregnancy complicated by: Patient Active Problem List   Diagnosis Date Noted  . Drug use affecting pregnancy, antepartum 08/12/2016  . Preterm premature rupture of membranes (PPROM) with unknown onset of labor 08/11/2016  . Fetal cardiac echogenic focus 06/12/2016  . Supervision of high risk pregnancy, antepartum 06/10/2016  . High risk pregnancy due to history of preterm labor, antepartum 06/10/2016    Overnight/24hr events:  None  Subjective:  No s/s of decreased FM, PTL or VB and no fevers, chills, belly pains.   Objective:    Current Vital Signs 24h Vital Sign Ranges  T 98.4 F (36.9 C) Temp  Avg: 98.3 F (36.8 C)  Min: 97.6 F (36.4 C)  Max: 98.7 F (37.1 C)  BP (!) 105/56 BP  Min: 105/56  Max: 117/57  HR 63 Pulse  Avg: 69.2  Min: 56  Max: 87  RR 16 Resp  Avg: 16  Min: 16  Max: 16  SaO2 100 % Not Delivered No Data Recorded       24 Hour I/O Current Shift I/O  Time Ins Outs No intake/output data recorded. No intake/output data recorded.   Physical exam: General: Well nourished, well developed female in no acute distress. Abdomen: gravid, nttp Cardiovascular: S1, S2 normal, no murmur, rub or gallop, regular rate and rhythm Respiratory: CTAB Extremities: no clubbing, cyanosis or edema Skin: Warm and dry.   Medications: Current Facility-Administered Medications  Medication Dose Route Frequency Provider Last Rate Last Dose  . acetaminophen (TYLENOL) tablet 650 mg  650 mg Oral Q4H PRN Montez MoritaMarie D Lawson, CNM   650 mg at 08/15/16 2140  . amoxicillin (AMOXIL) capsule 500 mg  500 mg Oral Q8H Montez MoritaMarie D Lawson, CNM   500 mg at 08/18/16 [id number]  . calcium carbonate (TUMS - dosed in mg elemental calcium) chewable tablet 400 mg of elemental calcium  2 tablet Oral Q4H PRN Montez MoritaMarie D Lawson, CNM      . docusate sodium  (COLACE) capsule 100 mg  100 mg Oral Daily Montez MoritaMarie D Lawson, CNM   100 mg at 08/17/16 1016  . lactated ringers infusion   Intravenous Continuous Montez MoritaMarie D Lawson, CNM   Stopped at 08/13/16 2137  . ondansetron (ZOFRAN) injection 4 mg  4 mg Intravenous Q6H PRN Rhona RaiderJacob J Stinson, DO   4 mg at 08/12/16 1806  . prenatal multivitamin tablet 1 tablet  1 tablet Oral Q1200 Montez MoritaMarie D Lawson, CNM   1 tablet at 08/17/16 1016  . sodium chloride flush (NS) 0.9 % injection 3 mL  3 mL Intravenous Q12H Goodridge Bingharlie Judith Demps, MD   3 mL at 08/17/16 2204  . zolpidem (AMBIEN) tablet 5 mg  5 mg Oral QHS PRN Montez MoritaMarie D Lawson, CNM   5 mg at 08/15/16 2248    Labs:   Recent Labs Lab 08/11/16 1545  WBC 11.9*  HGB 10.5*  HCT 30.0*  PLT 247   O POS  Radiology: no new imaging.   Assessment & Plan:  Pt doing well *IUP: fetal status reassuring. Continue with daily NSTs. Pt for 1hr glucola tomorrow. Doesn't desire BTL. *PPROM: last day of latency abx today. S/p bmz course on admission. Continue to follow closely. Consider repeat BMZ on 11/13 (2wks s/p 1st course  -10/31: 52%, 1210g, no AC, cephalic. EDC changed to current one -10/26: 2/50/-2 @ [facility] *  Preterm: see above. S/p nicu c/s already.  *GBS pos: tx in labor *h/o THC use: screen again PRN *PPx: SCDs, OOB ad lib.  *Dispo: delivery at 34wks or any s/s of maternal/fetal distress  Patty Espinoza, Jr. MD Attending Center for [facility](Faculty Practice)

## 2016-08-19 LAB — GLUCOSE TOLERANCE, 1 HOUR: Glucose, 1 Hour GTT: 196 mg/dL — ABNORMAL HIGH (ref 70–140)

## 2016-08-19 NOTE — Progress Notes (Signed)
Initial Nutrition Assessment  DOCUMENTATION CODES:   Not applicable  INTERVENTION:  Regular diet May order double protein portions, snacks TID and from retail  NUTRITION DIAGNOSIS:   Increased nutrient needs related to  (pregnancy and fetal growth requirements) as evidenced by  (29 weeks IUP).  GOAL:   Patient will meet greater than or equal to 90% of their needs, Weight gain  MONITOR:  Weight trends  REASON FOR ASSESSMENT:  Antenatal   ASSESSMENT:   29 1/7 weeks, PROM. pre-preg weight 115 lbs, 24 Lb weight gain. 1 hr GTT 196, 3 hr GTT 11/7. Appetite good  Diet Order:  Diet regular Room service appropriate? Yes; Fluid consistency: Thin  Skin:  Reviewed, no issues  Height:   Ht Readings from Last 1 Encounters:  08/11/16 5\' 2"  (1.575 m)    Weight:   Wt Readings from Last 1 Encounters:  08/14/16 139 lb 4.8 oz (63.2 kg)    Ideal Body Weight:     BMI:  Body mass index is 25.48 kg/m.  Estimated Nutritional Needs:   Kcal:  1700-1900  Protein:  75-85 g  Fluid:  2 L  EDUCATION NEEDS:   No education needs identified at this time  Inez PilgrimKatherine Amadou Katzenstein M.Odis LusterEd. R.D. LDN Neonatal Nutrition Support Specialist/RD III Pager [phone]      Phone [phone]

## 2016-08-19 NOTE — Progress Notes (Signed)
ACULTY PRACTICE ANTEPARTUM COMPREHENSIVE PROGRESS NOTE  Patty Espinoza is a 25 y.o. [id number] at 3546w1d  who is admitted for PROM on 10/29   Fetal presentation is unsure. Length of Stay:  8  Days  Subjective: Pt denies any ut ctx or cramps. She reports + Fm. Still leaks some fluid at times   Vitals:  Blood pressure (!) 111/40, pulse 70, temperature 98.9 F (37.2 C), temperature source Oral, resp. rate 16, height 5\' 2"  (1.575 m), weight 139 lb 4.8 oz (63.2 kg), last menstrual period 12/14/2015, SpO2 100 %. Physical Examination: Lungs clear Heart RRR Abd soft + BS gravid non tender Pelvic deferred Ext non tender  Labs:  No results found for this or any previous visit (from the past 24 hour(s)).  Imaging Studies:     Medications:  Scheduled . docusate sodium  100 mg Oral Daily  . prenatal multivitamin  1 tablet Oral Q1200  . sodium chloride flush  3 mL Intravenous Q12H   I have reviewed the patient's current medications.  ASSESSMENT: Patient Active Problem List   Diagnosis Date Noted  . Drug use affecting pregnancy, antepartum 08/12/2016  . Preterm premature rupture of membranes (PPROM) with unknown onset of labor 08/11/2016  . Fetal cardiac echogenic focus 06/12/2016  . Supervision of high risk pregnancy, antepartum 06/10/2016  . High risk pregnancy due to history of preterm labor, antepartum 06/10/2016    PLAN: IUP 29 1/7 weeks PPROM  No S/Sx of infection. S/P BMZ and antibiotics. Glucola today Continue with current management   Hermina StaggersMichael L Aine Strycharz 08/19/2016,6:16 AM

## 2016-08-20 ENCOUNTER — Encounter (HOSPITAL_COMMUNITY): Payer: Self-pay | Admitting: Neonatology

## 2016-08-20 DIAGNOSIS — O411230 Chorioamnionitis, third trimester, not applicable or unspecified: Secondary | ICD-10-CM

## 2016-08-20 DIAGNOSIS — Z3A29 29 weeks gestation of pregnancy: Secondary | ICD-10-CM

## 2016-08-20 DIAGNOSIS — O42113 Preterm premature rupture of membranes, onset of labor more than 24 hours following rupture, third trimester: Secondary | ICD-10-CM

## 2016-08-20 LAB — CBC
HCT: 30.8 % — ABNORMAL LOW (ref 36.0–46.0)
HEMOGLOBIN: 10.7 g/dL — AB (ref 12.0–15.0)
MCH: 28 pg (ref 26.0–34.0)
MCHC: 34.7 g/dL (ref 30.0–36.0)
MCV: 80.6 fL (ref 78.0–100.0)
Platelets: 225 10*3/uL (ref 150–400)
RBC: 3.82 MIL/uL — AB (ref 3.87–5.11)
RDW: 15 % (ref 11.5–15.5)
WBC: 19.1 10*3/uL — ABNORMAL HIGH (ref 4.0–10.5)

## 2016-08-20 LAB — TYPE AND SCREEN
ABO/RH(D): O POS
ANTIBODY SCREEN: NEGATIVE

## 2016-08-20 LAB — GLUCOSE, FASTING GESTATIONAL: GLUCOSE, FASTING-GESTATIONAL: 90 mg/dL

## 2016-08-20 MED ORDER — SODIUM CHLORIDE 0.9 % IV SOLN
2.0000 g | Freq: Four times a day (QID) | INTRAVENOUS | Status: AC
  Administered 2016-08-20 – 2016-08-21 (×6): 2 g via INTRAVENOUS
  Filled 2016-08-20 (×6): qty 2000

## 2016-08-20 MED ORDER — WITCH HAZEL-GLYCERIN EX PADS: 1.0000 "application " | MEDICATED_PAD | CUTANEOUS | Status: DC | PRN

## 2016-08-20 MED ORDER — IBUPROFEN 600 MG PO TABS
600.0000 mg | ORAL_TABLET | Freq: Four times a day (QID) | ORAL | Status: DC
  Administered 2016-08-20 – 2016-08-22 (×7): 600 mg via ORAL
  Filled 2016-08-20 (×8): qty 1

## 2016-08-20 MED ORDER — FENTANYL 2.5 MCG/ML BUPIVACAINE 1/10 % EPIDURAL INFUSION (WH - ANES)
INTRAMUSCULAR | Status: AC
  Filled 2016-08-20: qty 100

## 2016-08-20 MED ORDER — PRENATAL MULTIVITAMIN CH
1.0000 | ORAL_TABLET | Freq: Every day | ORAL | Status: DC
  Administered 2016-08-21 – 2016-08-22 (×2): 1 via ORAL
  Filled 2016-08-20 (×2): qty 1

## 2016-08-20 MED ORDER — OXYTOCIN 40 UNITS IN LACTATED RINGERS INFUSION - SIMPLE MED
1.0000 m[IU]/min | INTRAVENOUS | Status: DC
  Administered 2016-08-20: 666 m[IU]/min via INTRAVENOUS
  Administered 2016-08-20: 2 m[IU]/min via INTRAVENOUS
  Filled 2016-08-20: qty 1000

## 2016-08-20 MED ORDER — SIMETHICONE 80 MG PO CHEW: 80.0000 mg | CHEWABLE_TABLET | ORAL | Status: DC | PRN

## 2016-08-20 MED ORDER — EPHEDRINE 5 MG/ML INJ: 10.0000 mg | INTRAVENOUS | Status: DC | PRN

## 2016-08-20 MED ORDER — TETANUS-DIPHTH-ACELL PERTUSSIS 5-2.5-18.5 LF-MCG/0.5 IM SUSP: 0.5000 mL | Freq: Once | INTRAMUSCULAR | Status: DC

## 2016-08-20 MED ORDER — LACTATED RINGERS IV SOLN
1.0000 g/h | INTRAVENOUS | Status: AC
  Filled 2016-08-20: qty 80

## 2016-08-20 MED ORDER — BENZOCAINE-MENTHOL 20-0.5 % EX AERO: 1.0000 "application " | INHALATION_SPRAY | CUTANEOUS | Status: DC | PRN

## 2016-08-20 MED ORDER — OXYCODONE-ACETAMINOPHEN 5-325 MG PO TABS: 2.0000 | ORAL_TABLET | ORAL | Status: DC | PRN

## 2016-08-20 MED ORDER — ONDANSETRON HCL 4 MG/2ML IJ SOLN: 4.0000 mg | INTRAMUSCULAR | Status: DC | PRN

## 2016-08-20 MED ORDER — ONDANSETRON HCL 4 MG/2ML IJ SOLN: 4.0000 mg | Freq: Four times a day (QID) | INTRAMUSCULAR | Status: DC | PRN

## 2016-08-20 MED ORDER — GENTAMICIN SULFATE 40 MG/ML IJ SOLN
140.0000 mg | Freq: Three times a day (TID) | INTRAVENOUS | Status: AC
  Administered 2016-08-20 – 2016-08-21 (×4): 140 mg via INTRAVENOUS
  Filled 2016-08-20 (×5): qty 3.5

## 2016-08-20 MED ORDER — COCONUT OIL OIL: 1.0000 "application " | TOPICAL_OIL | Status: DC | PRN

## 2016-08-20 MED ORDER — LACTATED RINGERS IV SOLN
INTRAVENOUS | Status: DC
  Administered 2016-08-20: 07:00:00 via INTRAVENOUS

## 2016-08-20 MED ORDER — OXYCODONE-ACETAMINOPHEN 5-325 MG PO TABS
1.0000 | ORAL_TABLET | Freq: Once | ORAL | Status: DC
  Filled 2016-08-20: qty 1

## 2016-08-20 MED ORDER — OXYTOCIN 40 UNITS IN LACTATED RINGERS INFUSION - SIMPLE MED: 2.5000 [IU]/h | INTRAVENOUS | Status: DC

## 2016-08-20 MED ORDER — LIDOCAINE HCL (PF) 1 % IJ SOLN
30.0000 mL | INTRAMUSCULAR | Status: DC | PRN
  Filled 2016-08-20: qty 30

## 2016-08-20 MED ORDER — LACTATED RINGERS IV SOLN: INTRAVENOUS | Status: DC

## 2016-08-20 MED ORDER — PHENYLEPHRINE 40 MCG/ML (10ML) SYRINGE FOR IV PUSH (FOR BLOOD PRESSURE SUPPORT)
PREFILLED_SYRINGE | INTRAVENOUS | Status: AC
  Filled 2016-08-20: qty 10

## 2016-08-20 MED ORDER — DIPHENHYDRAMINE HCL 50 MG/ML IJ SOLN: 12.5000 mg | INTRAMUSCULAR | Status: DC | PRN

## 2016-08-20 MED ORDER — PHENYLEPHRINE 40 MCG/ML (10ML) SYRINGE FOR IV PUSH (FOR BLOOD PRESSURE SUPPORT): 80.0000 ug | PREFILLED_SYRINGE | INTRAVENOUS | Status: DC | PRN

## 2016-08-20 MED ORDER — LACTATED RINGERS IV SOLN
INTRAVENOUS | Status: DC
Start: 2016-08-20 — End: 2016-08-20

## 2016-08-20 MED ORDER — NALOXONE HCL 0.4 MG/ML IJ SOLN
INTRAMUSCULAR | Status: AC
  Filled 2016-08-20: qty 1

## 2016-08-20 MED ORDER — TERBUTALINE SULFATE 1 MG/ML IJ SOLN
0.2500 mg | Freq: Once | INTRAMUSCULAR | Status: DC | PRN
  Filled 2016-08-20: qty 1

## 2016-08-20 MED ORDER — DIPHENHYDRAMINE HCL 25 MG PO CAPS: 25.0000 mg | ORAL_CAPSULE | Freq: Four times a day (QID) | ORAL | Status: DC | PRN

## 2016-08-20 MED ORDER — OXYCODONE-ACETAMINOPHEN 5-325 MG PO TABS
1.0000 | ORAL_TABLET | ORAL | Status: DC | PRN
  Administered 2016-08-20 – 2016-08-22 (×4): 1 via ORAL
  Filled 2016-08-20 (×3): qty 1

## 2016-08-20 MED ORDER — SOD CITRATE-CITRIC ACID 500-334 MG/5ML PO SOLN
30.0000 mL | ORAL | Status: DC | PRN
  Filled 2016-08-20: qty 15

## 2016-08-20 MED ORDER — ZOLPIDEM TARTRATE 5 MG PO TABS: 5.0000 mg | ORAL_TABLET | Freq: Every evening | ORAL | Status: DC | PRN

## 2016-08-20 MED ORDER — MAGNESIUM SULFATE BOLUS VIA INFUSION
4.0000 g | Freq: Once | INTRAVENOUS | Status: AC
  Administered 2016-08-20: 4 g via INTRAVENOUS
  Filled 2016-08-20: qty 500

## 2016-08-20 MED ORDER — FENTANYL 2.5 MCG/ML BUPIVACAINE 1/10 % EPIDURAL INFUSION (WH - ANES)
14.0000 mL/h | INTRAMUSCULAR | Status: DC | PRN
Start: 2016-08-20 — End: 2016-08-20

## 2016-08-20 MED ORDER — FENTANYL CITRATE (PF) 100 MCG/2ML IJ SOLN
50.0000 ug | INTRAMUSCULAR | Status: DC | PRN
  Administered 2016-08-20 (×3): 100 ug via INTRAVENOUS
  Filled 2016-08-20 (×3): qty 2

## 2016-08-20 MED ORDER — LACTATED RINGERS IV SOLN: 500.0000 mL | Freq: Once | INTRAVENOUS | Status: DC

## 2016-08-20 MED ORDER — SENNOSIDES-DOCUSATE SODIUM 8.6-50 MG PO TABS
2.0000 | ORAL_TABLET | ORAL | Status: DC
  Administered 2016-08-21 – 2016-08-22 (×2): 2 via ORAL
  Filled 2016-08-20 (×3): qty 2

## 2016-08-20 MED ORDER — LACTATED RINGERS IV SOLN
500.0000 mL | INTRAVENOUS | Status: DC | PRN
  Administered 2016-08-20: 1000 mL via INTRAVENOUS

## 2016-08-20 MED ORDER — SODIUM CHLORIDE 0.9 % IV SOLN: 2.0000 g | Freq: Four times a day (QID) | INTRAVENOUS | Status: DC

## 2016-08-20 MED ORDER — ACETAMINOPHEN 325 MG PO TABS: 650.0000 mg | ORAL_TABLET | ORAL | Status: DC | PRN

## 2016-08-20 MED ORDER — DIBUCAINE 1 % RE OINT: 1.0000 "application " | TOPICAL_OINTMENT | RECTAL | Status: DC | PRN

## 2016-08-20 MED ORDER — OXYTOCIN BOLUS FROM INFUSION: 500.0000 mL | Freq: Once | INTRAVENOUS | Status: DC

## 2016-08-20 MED ORDER — ONDANSETRON HCL 4 MG PO TABS: 4.0000 mg | ORAL_TABLET | ORAL | Status: DC | PRN

## 2016-08-20 NOTE — Progress Notes (Signed)
LABOR PROGRESS NOTE  Patty Espinoza is a 25 y.o. [id number] at 182w2d  admitted for pprom, now with triple i  Subjective: Febrile, moderately painful ctxns  Objective: BP 130/60   Pulse (!) 122   Temp (!) 103.2 F (39.6 C) (Oral)   Resp 16   Ht 5\' 2"  (1.575 m)   Wt 139 lb 4.8 oz (63.2 kg)   LMP 12/14/2015   SpO2 100%   BMI 25.48 kg/m  or  Vitals:   08/20/16 1026 08/20/16 1031 08/20/16 1111 08/20/16 1230  BP:  (!) 127/51  130/60  Pulse:  (!) 130  (!) 122  Resp:  (!) 21  16  Temp: (!) 103 F (39.4 C)  (!) 103.2 F (39.6 C)   TempSrc: Oral  Oral   SpO2:      Weight:      Height:        145/mod/-a/recurrent variable Dilation: 3 Effacement (%): 50 Station: -2 Presentation: Vertex Exam by:: S Earl RNC  Labs: Lab Results  Component Value Date   WBC 19.1 (H) 08/20/2016   HGB 10.7 (L) 08/20/2016   HCT 30.8 (L) 08/20/2016   MCV 80.6 08/20/2016   PLT 225 08/20/2016    Patient Active Problem List   Diagnosis Date Noted  . Drug use affecting pregnancy, antepartum 08/12/2016  . Preterm premature rupture of membranes (PPROM) with unknown onset of labor 08/11/2016  . Fetal cardiac echogenic focus 06/12/2016  . Supervision of high risk pregnancy, antepartum 06/10/2016  . High risk pregnancy due to history of preterm labor, antepartum 06/10/2016    Assessment / Plan: 25 y.o. [id number] at 282w2d here for pprom, now with triple i  Labor: iupc placed, amnioinfusion started, for recurrent variable decels. Holding on pitocin given prolonged decel with that, will see if native contraction pattern adequate for cervical change Fetal Wellbeing:  Cat 2 Pain Control:  Epidural eventually Anticipated MOD:  Vag Triple I: amp/gent running Pprom: s/p bmz, on mg for neuroprotection which we will discontinue after 12 hours  Silvano BilisNoah B Wouk, MD 08/20/2016, 12:56 PM

## 2016-08-20 NOTE — Progress Notes (Signed)
ANTIBIOTIC CONSULT NOTE - INITIAL  Pharmacy Consult for Gentamicin Indication: Chorioamnionitis   Allergies  Allergen Reactions  . Latex Rash    Patient Measurements: Height: 5\' 2"  (157.5 cm) Weight: 139 lb 4.8 oz (63.2 kg) IBW/kg (Calculated) : 50.1 Adjusted Body Weight: 54kg  Vital Signs: Temp: 101.9 F (38.8 C) (11/07 0700) Temp Source: Oral (11/07 0700) BP: 123/62 (11/07 0720) Pulse Rate: 97 (11/07 0720)  Labs:  Recent Labs  08/20/16 0623  WBC 19.1*  HGB 10.7*  PLT 225   No results for input(s): GENTTROUGH, GENTPEAK, GENTRANDOM in the last 72 hours.   Microbiology: Recent Results (from the past 720 hour(s))  Culture, beta strep (group b only)     Status: Abnormal   Collection Time: 08/13/16  8:07 AM  Result Value Ref Range Status   Specimen Description VAGINAL/RECTAL  Final   Special Requests NONE  Final   Culture (A)  Final    GROUP B STREP(S.AGALACTIAE)ISOLATED CRITICAL RESULT CALLED TO, READ BACK BY AND VERIFIED WITH: Marilynn Rail. WICKER, [facility] AT 1121 ON [id number] BY Lucienne CapersS. YARBROUGH Performed at [facility]     Report Status 08/14/2016 FINAL  Final    Medications: Ampicillin 2gm IV q6hrs   Assessment: 25 y.o. female [id number] at 122w2d on ampicillin and gentamicin for chorio.  Estimated Ke = 0.297, Vd = 24.3L  Goal of Therapy:  Gentamicin peak 6-8 mg/L and Trough < 1 mg/L  Plan:  Gentamicin 140 mg IV every 8 hrs  Check Scr with next labs if gentamicin continued. Will check gentamicin levels if continued > 72hr or clinically indicated.  Wendie Simmerynthia Dyanne Yorks, PharmD, BCPS Clinical Pharmacist

## 2016-08-20 NOTE — Progress Notes (Signed)
ACULTY PRACTICE ANTEPARTUM COMPREHENSIVE PROGRESS NOTE  Patty Espinoza is a 25 y.o. [id number] at 525w1d  who is admitted for PROM on 10/29   Fetal presentation is cephalic. Length of Stay:  9  Days  Subjective: Patient complains of feeling cold despite blankets and room temp up, skin "hurts all over". Having some lower abdominal discomfort. Good fetal movement. Still leaks some fluid at times   Vitals:  Blood pressure (!) 115/59, pulse 62, temperature 99.4 F (37.4 C), temperature source Oral, resp. rate 20, height 5\' 2"  (1.575 m), weight 139 lb 4.8 oz (63.2 kg), last menstrual period 12/14/2015, SpO2 100 %. Physical Examination: NAD, A&O x3 Lungs clear, wheezing  Heart RRR, no murmurs Abd soft + BS gravid non tender, palpate contraction Pelvic deferred Ext non tender  Labs:  Results for orders placed or performed during the hospital encounter of 08/11/16 (from the past 24 hour(s))  Glucose tolerance, 1 hour   Collection Time: 08/19/16  7:54 AM  Result Value Ref Range   Glucose, 1 Hour GTT 196 (H) 70 - 140 mg/dL    Imaging Studies:     Medications:  Scheduled . docusate sodium  100 mg Oral Daily  . prenatal multivitamin  1 tablet Oral Q1200  . sodium chloride flush  3 mL Intravenous Q12H   I have reviewed the patient's current medications.  ASSESSMENT: Patient Active Problem List   Diagnosis Date Noted  . Drug use affecting pregnancy, antepartum 08/12/2016  . Preterm premature rupture of membranes (PPROM) with unknown onset of labor 08/11/2016  . Fetal cardiac echogenic focus 06/12/2016  . Supervision of high risk pregnancy, antepartum 06/10/2016  . High risk pregnancy due to history of preterm labor, antepartum 06/10/2016    PLAN: 1. PPROM  Possibly starting to develop infection  CBC now, will get frequent temps  Will place on the monitor to evaluate   If is starting to contract, or has other evidence of intrauterine infection, will need to start magnesium for  neuroprotection and move towards delivery. 2. 29 week pregnancy  3hr GTT today.  Limited bedside US done by me to confirm presentation - baby vertex.   Continue with current management   Christo Hain JEHIEL 08/20/2016,6:21 AM

## 2016-08-20 NOTE — Progress Notes (Signed)
NICU notified of status.

## 2016-08-20 NOTE — Progress Notes (Signed)
Team at bedside.

## 2016-08-20 NOTE — Progress Notes (Signed)
Patient ID: Patty Espinoza, female   DOB: 06/29/1991, 25 y.o.   MRN: [id number]  Pt developed fever and vomited once.  Lab Results  Component Value Date   WBC 19.1 (H) 08/20/2016   HGB 10.7 (L) 08/20/2016   HCT 30.8 (L) 08/20/2016   MCV 80.6 08/20/2016   PLT 225 08/20/2016   Dilation: 3 Effacement (%): 50 Station: -2 Presentation: Vertex Exam by:: Dr. Adrian BlackwaterStinson  Likely has beginnings of intrauterine infection/inflammation Move to birthing suite Start amp/gent Start magnesium for neuroprotection NICU notified Start pitocin  Patty HeritageJacob J Tomeika Weinmann, DO

## 2016-08-20 NOTE — Progress Notes (Signed)
Ctx's tracing inverted

## 2016-08-20 NOTE — Consult Note (Signed)
Neonatology Note:   Attendance at Delivery:    I was asked by Dr. Wouk to attend this NSVD at 29 2/7 weeks after PPROM and onset of maternal fever today. The mother is a G5P3A1 O pos, GBS positive with ROM since 10/29. Mother was treated with Ampicillin > 4 hours prior to delivery; highest maternal temperature during labor was 103.2 degrees. She got a 7-day course of Azithromycin and Ampicillin/Amoxicillin on admission. She also got Betamethasone on 10/29-30. IOL today due to maternal fever. Mother got 3 doses of IV Fentanyl this afternoon. She also got 1 gm of Magnesium sulfate for neuroprotection. Amniotic fluid pink. Infant was already delivered when our team arrived, cord being clamped. The infant was blue, with minimal respiratory effort, HR about 80, and decreased tone at birth. We bulb suctioned and I applied PPV for about 2-3 minutes, with increasing HR and improvement in color. Intubated at 5 minutes of life, on the first attempt, with a 3.0 mm ETT to a depth of 8 cm at the lips; the CO2 detector turned yellow right away and equal breath sounds were heard. Ap 4/7. We considered giving Narcan, but the baby began to breathe spontaneously, so we held this. Mother does not have a history of illicit/prescription opioid use and her UDS was negative. Shown to the parents briefly in the DR, then transported to the NICU, being bagged via ETT, getting about 40-50% FIO2 en route.   Elnore Cosens C. Kasson Lamere, MD 

## 2016-08-21 LAB — RPR: RPR: NONREACTIVE

## 2016-08-21 MED ORDER — POLYSACCHARIDE IRON COMPLEX 150 MG PO CAPS
150.0000 mg | ORAL_CAPSULE | Freq: Every day | ORAL | Status: DC
  Administered 2016-08-21 – 2016-08-22 (×2): 150 mg via ORAL
  Filled 2016-08-21 (×2): qty 1

## 2016-08-21 NOTE — Progress Notes (Signed)
Post Partum Day #1 Subjective: no complaints, up ad lib, voiding and tolerating PO  Objective: Blood pressure (!) 105/52, pulse 65, temperature 98 F (36.7 C), temperature source Oral, resp. rate 18, height 5\' 2"  (1.575 m), weight 139 lb 4.8 oz (63.2 kg), last menstrual period 12/14/2015, SpO2 100 %, unknown if currently breastfeeding.  Physical Exam:  General: alert, cooperative and no distress Lochia: appropriate Uterine Fundus: firm Incision: none DVT Evaluation: No evidence of DVT seen on physical exam. No cords or calf tenderness. No significant calf/ankle edema.   Recent Labs  08/20/16 0623  HGB 10.7*  HCT 30.8*    Assessment/Plan: Plan for discharge tomorrow, Breastfeeding, Lactation consult, Social Work consult and Contraception POP   LOS: 10 days   Roe CoombsRachelle A Kathye Cipriani, CNM 08/21/2016, 8:28 AM

## 2016-08-21 NOTE — Progress Notes (Signed)
CSW attempted to meet with MOB to introduce services, offer support and complete assessment due to NICU admission, but she was busy with her RN at this time and CSW did not want to interrupt.  CSW will attempt again at a later time. 

## 2016-08-21 NOTE — Lactation Note (Signed)
This note was copied from a baby's chart. Lactation Consultation Note  Patient Name: Patty Espinoza ZOXWR'UToday's Date: 08/21/2016 Reason for consult: Initial assessment;NICU baby  Baby 24 hours old. Mom reports that she did not get milk with third baby and is not seeing anything with this baby. Discussed progression of milk coming to volume and enc hand expressing after pumping. Discussed having pictures and audio of baby while pumping as well. Mom aware of pumping rooms in NICU. Assisted mom with hand expression and collected several drops of colostrum. Mom given NICU booklet with review, and discussed EBM storage guidelines. Enc mom to pump every 2-3 hours for a total of 8 times/24 hours. Enc mom to sleep for a 4 to 5-hour stretch at night and pump more often when first getting up in the morning. Mom aware of OP/BFSG and LC phone line assistance after D/C.   [facility] and [facility].    Maternal Data Has patient been taught Hand Expression?: Yes Does the patient have breastfeeding experience prior to this delivery?: Yes  Feeding    LATCH Score/Interventions                      Lactation Tools Discussed/Used Tools: Pump Breast pump type: Double-Electric Breast Pump Pump Review: Setup, frequency, and cleaning;Milk Storage Initiated by:: bedside RN Date initiated:: 08/20/16   Consult Status Consult Status: Follow-up Date: 08/22/16 Follow-up type: In-patient    Sherlyn HayJennifer D Johnathen Testa 08/21/2016, 3:49 PM

## 2016-08-22 MED ORDER — OXYCODONE-ACETAMINOPHEN 5-325 MG PO TABS: 1.0000 | ORAL_TABLET | ORAL | 0 refills | Status: DC | PRN

## 2016-08-22 MED ORDER — IBUPROFEN 600 MG PO TABS: 600.0000 mg | ORAL_TABLET | Freq: Four times a day (QID) | ORAL | 2 refills | Status: DC

## 2016-08-22 MED ORDER — SENNOSIDES-DOCUSATE SODIUM 8.6-50 MG PO TABS: 2.0000 | ORAL_TABLET | Freq: Two times a day (BID) | ORAL | 2 refills | Status: DC

## 2016-08-22 MED ORDER — FUSION PLUS PO CAPS: 1.0000 | ORAL_CAPSULE | Freq: Every day | ORAL | 2 refills | Status: DC

## 2016-08-22 NOTE — Clinical Social Work Maternal (Signed)
CLINICAL SOCIAL WORK MATERNAL/CHILD NOTE  Patient Details  Name: Patty Espinoza MRN: [id number] Date of Birth: 02/04/1991  Date:  08/22/2016  Clinical Social Worker Initiating Note:  Arthelia Callicott E. Claudius Mich, LCSW Date/ Time Initiated:  08/22/16/0845     Child's Name:  DeAngelo Taborn Jr.   Legal Guardian:  Mother (Nelda Kulikowski and DeAngelo Taborn Sr.)   Need for Interpreter:  None   Date of Referral:  08/21/16     Reason for Referral:  Parental Support of Premature Babies < 32 weeks/or Critically Ill babies    Referral Source:  CNM   Address:  [address], [place], [place] [address]  Phone number:  [phone]   Household Members:  Significant Other   Natural Supports (not living in the home):  Parent (MOB reports that her mother is very supportive and is helping with her 5 year old son.)   Professional Supports: None   Employment:     Type of Work: MOB reports that both she and FOB are looking for work.   Education:      Financial Resources:  Medicaid   Other Resources:      Cultural/Religious Considerations Which May Impact Care: None stated.  Strengths:  Ability to meet basic needs , Understanding of illness (MOB states her older son has pediatric follow up at [facility].  She is uncertain at this time where she plans to take baby for follor up.  MOB states financial hardship and has not been able to start preparting the home for infant.)   Risk Factors/Current Problems:  Substance Use , Family/Relationship Issues  (MOB reports that FOB smokes marijuana.  She denies use herself, but states she is around it.  MOB reports custody issues with ex-husband.)   Cognitive State:  Able to Concentrate , Alert , Linear Thinking    Mood/Affect:  Relaxed , Interested    CSW Assessment: CSW met with MOB in her first floor room/116 to introduce services, offer support, and complete assessment due to baby's admission to NICU at 29.2 weeks.  MOB was  pleasant and receptive to CSW's visit.  CSW found MOB to be easy to engage. MOB reports that she is doing well and that she has had a NICU experience before.  She states her last son was born at 29 weeks and spent 44 days in the NICU at [facility].  She states she is feeling positive about her newborn, as she knows generally what to expect.   MOB states she has three other children in addition to her newborn.  Her oldest son/Alex (DOB 07/23/11) currently lives with MGM in [place].  MOB states he goes to school there because she missed the cutoff to register for pre-k when she moved to [place] in April 2017 and therefore decided to have her son stay with her mother.  She reports that he spends the weekends with MOB.  MOB has two sons from her previous marriage and states that she is currently legally separated, but not divorced.  She states her sons Miguel (DOB 11/03/13) and Siaul (DOB 08/08/14) live with their father.  She states she is in the process of trying to get joint custody and reports that their father hired an attorney and currently has full custody of the children.  MOB reports that this is her first child with FOB, but that he has 3 other children also, ages 7, 4 and 1.   CSW asked how she coped emotionally with her last NICU   experience.  MOB states that she was under "a lot of stress" when her son Matthew Folks was in the NICU.  She denies any hx of PMADs and thinks her stress came from the situation with the baby's father.  She states she is in a better situation at this time and that FOB is involved and supportive.  She reports that he has been here with her. CSW asked if parents are currently working and MOB stated that they are both looking for employment.  She reports that the last job she had was at an [employer] in [place] inquired as to what brought the couple to [place] and she reports that FOB has "court-ordered stuff here that he has to take care of."  She did not elaborate.  CSW asked how they  support themselves financially and inquired about their living situation.  She reports that FOB's family has a car dealership in [place] and provide financial support.  MOB states they live in an Extended Stay, which costs $40 per day.  She reports they have lived here since June of 2016 and feel it is stable housing.  She states that they have not been able to get supplies for baby since neither of them are working.  CSW offered assistance through [employer].  MOB accepted and was appreciative.  CSW encouraged her to save money for a car seat and informed her of the possibility of obtaining one from [employer] for $30 if she has no other means to get one.  MOB stated understanding and thinks they will be able to purchase a car seat for baby.   CSW evaluated how MOB is coping at this time and provided her with education regarding PMADs and the importance of talking with a medical professional if she is concerned about her emotional health at any time.  MOB reports she "broke down this morning" and thinks it was related to stress over pumping and not getting any milk for baby.  CSW encouraged her to keep pumping every three hours, rest, drink water, and ask for support from our lactation consultants.  CSW assured her that it is normal to not have milk yet at this stage.  MOB seemed appreciative.  She feels she is coping well with baby's premature birth and NICU admission and was attentive to information CSW provided.   CSW also provided education regarding SIDS precautions, which MOB states she is aware of.  CSW asked if anyone smokes and MOB reports that FOB smokes outside.  CSW encouraged her to ask him to change his clothes and wash his hands before handling baby. CSW inquired about a positive UDS for THC on admission and MOB denies use herself.  She states that FOB smokes marijuana and that she is around it.  CSW asked where FOB is smoking and MOB reports that he has recently stopped smoking  inside.  CSW cautioned her about the increase of SIDS risk with any smoke around baby.  She stated understanding.  CSW explained hospital drug screen policy and mandated CPS reporting for positive screens.  MOB stated understanding and reports that she is not concerned.  MOB states that she has spoken with the [employer] staff about not being able to put FOB's name on the birth certificate since she is not legally divorced.  Her main concern is that her ex-husband would have rights to this baby.  CSW also informed her that she can refuse husband's information and that he will not  have legal rights to the child.  MOB states that her ex-husband has tried to get custody of her oldest child in the past, even though he is not the father.  CSW asked that she notify CSW if her ex-husband is trying to obtain custody of her new baby.   CSW explained ongoing support services offered by NICU CSW and gave contact information.  MOB states she has no barriers to visitation since FOB has a vehicle.    CSW Plan/Description:  Patient/Family Education , Psychosocial Support and Ongoing Assessment of Needs    Tiler Brandis Elizabeth, LCSW 08/22/2016, 3:11 PM 

## 2016-08-22 NOTE — Discharge Summary (Signed)
Obstetric Discharge Summary Reason for Admission: rupture of membranes and PPROM @29 .6 wks Prenatal Procedures: NST and ultrasound Intrapartum Procedures: spontaneous vaginal delivery Postpartum Procedures: none Complications-Operative and Postpartum: Chorio 08/20/16: had 24 hours Antibiotics, Afebrile X 24+ hours Hemoglobin  Date Value Ref Range Status  08/20/2016 10.7 (L) 12.0 - 15.0 g/dL Final   HCT  Date Value Ref Range Status  08/20/2016 30.8 (L) 36.0 - 46.0 % Final   Hematocrit  Date Value Ref Range Status  06/10/2016 33.0 (L) 34.0 - 46.6 % Final    Physical Exam:  General: alert, cooperative and no distress Lochia: appropriate Uterine Fundus: firm Incision: none DVT Evaluation: No evidence of DVT seen on physical exam. No cords or calf tenderness. No significant calf/ankle edema.  Discharge Diagnoses: Premature labor and Premature delivery @29 .6 wks, Chorioamionitis, PPROM  Discharge Information: Date: 08/22/2016 Activity: pelvic rest Diet: routine Medications: PNV, Ibuprofen, Colace, Iron and Percocet Condition: stable Instructions: refer to practice specific booklet Discharge to: home Follow-up Information    Remmy Crass A Erasto Sleight, CNM Follow up in 4 week(s).   Specialty:  Certified Nurse Midwife Contact information: [address] [place] [id number] [phone]           Newborn Data: Live born female  Birth Weight: 3 lb 3.5 oz (1460 g) APGAR: 4, 7  In NICU currently.   Roe Coombsachelle A Awais Cobarrubias, CNM 08/22/2016, 8:40 AM

## 2016-08-22 NOTE — Progress Notes (Signed)
CSW spoke with C. Malloy/Birth Registrar about MOB's concerns with her ex-husband (she refers to him as her ex-husband, but they are not legally divorced.)  CSW was not aware that biological parents had not signed Affidavit of Parentage to establish FOB's paternity since his information cannot be added to the birth certificate.  CSW will discuss this further with MOB, as her concern about her ex-husband pursuing custody is valid. 

## 2016-08-22 NOTE — Progress Notes (Signed)
Post Partum Day #2 Subjective: no complaints, up ad lib, voiding and tolerating PO  Objective: Blood pressure (!) 103/54, pulse (!) 48, temperature 98.4 F (36.9 C), temperature source Oral, resp. rate 16, height 5\' 2"  (1.575 m), weight 139 lb 4.8 oz (63.2 kg), last menstrual period 12/14/2015, SpO2 100 %, unknown if currently breastfeeding.  Physical Exam:  General: alert, cooperative and no distress Lochia: appropriate Uterine Fundus: firm Incision: no significant drainage, no dehiscence, no significant erythema DVT Evaluation: No evidence of DVT seen on physical exam. No cords or calf tenderness. No significant calf/ankle edema.   Recent Labs  08/20/16 0623  HGB 10.7*  HCT 30.8*    Assessment/Plan: Discharge home, Breastfeeding, Social Work consult and Contraception POP   LOS: 11 days   Roe CoombsRachelle A Kyarah Enamorado, CNM 08/22/2016, 8:36 AM

## 2016-08-22 NOTE — Plan of Care (Signed)
Problem: Education: Goal: Knowledge of condition will improve Outcome: Completed/Met Date Met: 08/22/16 MOB has done skin to skin with baby in NICU, per patient.  MOB is also want to breastfeed and has been pumping and hand expressing.  Colostrum was expressible by RN yesterday, but could not be expressed by RN today.  Education provided about how the milk does not come in sometimes until day 5 and provided emotional support.  Lactation also updated.

## 2016-08-22 NOTE — Lactation Note (Addendum)
This note was copied from a baby's chart. Lactation Consultation Note  Patient Name: Patty Espinoza ZOXWR'UToday's Date: 08/22/2016    Mom is tearful about her ability to express milk. So far, she has been unable to obtain more than 1mL (that is with pumping & hand expression). Mom has already put herself on an "oatmeal diet" (meal tray is in room & oatmeal items are noted). Mom was reassured.  Lactation history: Mom exclusively breast fed her 1st 2 children for 3 & 4 months, respectively (they were born at 3536 & [redacted] weeks gestation). Mom did not receive betamethasone for her 1st 2 children, but she did receive it for her 3rd child who was also born at 5929 weeks. Mom reports that the most milk she ever obtained was 20mL and that was at 1 month postpartum. Mom only lactated for 1 month w/that infant.   Some research shows that bethamesone given 3-9 days prior to delivery can delay lactogenesis II for up to 10 days postpartum- -this was shared w/Mom.   Mom's previous children range in age from 433yo-5yo.   Mom has my # to call when ready for me to return--we will ensure that flange size is correct and that Mom's hand expression technique is adequate.   Lurline HareRichey, Kathrene Sinopoli [facility] 08/22/2016, 8:28 AM

## 2016-08-22 NOTE — Lactation Note (Signed)
This note was copied from a baby's chart. Lactation Consultation Note RN asked LC to consult w/mom d/t getting discouraged d/t not seeing milk yet or feeling breast start to fill. Discussed w/mom supply and demand. Mom wasn't using premie setting on DEBP. Reviewed pumping settings and how often. Educated on colostrum coming first, consistency of colostrum and mature milk coming in 3-5 days. Mom seemed reassured. Patient Name: Patty Shawn Routemani Fraizer UJWJX'BToday's Date: 08/22/2016 Reason for consult: Follow-up assessment;NICU baby   Maternal Data    Feeding Feeding Type: Donor Breast Milk  LATCH Score/Interventions                      Lactation Tools Discussed/Used     Consult Status Consult Status: Follow-up Date: 08/22/16 Follow-up type: In-patient    Charyl DancerCARVER, Darrow Barreiro G 08/22/2016, 4:17 AM

## 2016-09-02 ENCOUNTER — Ambulatory Visit: Payer: Self-pay

## 2016-09-02 NOTE — Lactation Note (Signed)
This note was copied from a baby's chart. Lactation Consultation Note  Patient Name: Patty Espinoza UUVOZ'DToday's Date: 09/02/2016 Reason for consult: Follow-up assessment;Infant < 6lbs;NICU baby   Spoke with mother at bedside. She reports she is not pumping and is not planning to pump. She has no questions/concerns at this time.    Maternal Data Formula Feeding for Exclusion: Yes  Feeding Feeding Type: Donor Breast Milk Length of feed: 30 min  LATCH Score/Interventions                      Lactation Tools Discussed/Used     Consult Status Consult Status: Complete Follow-up type: Call as needed    Ed BlalockSharon S Noor Vidales 09/02/2016, 9:47 AM

## 2016-10-01 ENCOUNTER — Ambulatory Visit: Payer: Medicaid Other | Admitting: Obstetrics and Gynecology

## 2016-10-14 NOTE — L&D Delivery Note (Signed)
Delivery Note At 1:00 AM a viable female was delivered via  (Presentation:breech).  APGAR:5 , 8; weight 1510g (3lbs 5.3oz) .   Placenta status: spontaneous , complete.  Cord: nuchal x 1  with the following complications: .  Cord pH: NA  Anesthesia:  None Episiotomy:  None  Lacerations:  None Suture Repair: NA Est. Blood Loss (mL):  50 cc  Mom to postpartum.  Baby to NICU.  Patty Espinoza 05/20/2017, 1:12 AM  Patient arrived from 3rd in active labor. On cervical exam patient was found to be fully dilated with fetus in the frank breech position. Dr. Erin FullingHarraway-Smith notified, and came to the room for delivery. Patient delivered the fetus with minimal pushing effort. Body and head delivered in one push. Nuchal cord noted, and unwrapped after infant was fully delivered. Baby with spontaneous cry. Delayed cord clamping x 60 seconds, and then baby handed to the NICU.

## 2016-10-28 ENCOUNTER — Emergency Department (HOSPITAL_COMMUNITY)
Admission: EM | Admit: 2016-10-28 | Discharge: 2016-10-28 | Disposition: A | Discharge status: Home / Self Care | Payer: Medicaid Other | Attending: Emergency Medicine | Admitting: Emergency Medicine

## 2016-10-28 ENCOUNTER — Encounter (HOSPITAL_COMMUNITY): Payer: Self-pay | Admitting: Emergency Medicine

## 2016-10-28 DIAGNOSIS — R109 Unspecified abdominal pain: Secondary | ICD-10-CM | POA: Diagnosis present

## 2016-10-28 DIAGNOSIS — Z79899 Other long term (current) drug therapy: Secondary | ICD-10-CM | POA: Insufficient documentation

## 2016-10-28 DIAGNOSIS — Z3201 Encounter for pregnancy test, result positive: Secondary | ICD-10-CM | POA: Diagnosis not present

## 2016-10-28 DIAGNOSIS — Z87891 Personal history of nicotine dependence: Secondary | ICD-10-CM | POA: Diagnosis not present

## 2016-10-28 DIAGNOSIS — Z91040 Latex allergy status: Secondary | ICD-10-CM | POA: Insufficient documentation

## 2016-10-28 DIAGNOSIS — L02211 Cutaneous abscess of abdominal wall: Secondary | ICD-10-CM | POA: Diagnosis not present

## 2016-10-28 LAB — COMPREHENSIVE METABOLIC PANEL
ALBUMIN: 4.1 g/dL (ref 3.5–5.0)
ALT: 9 U/L — ABNORMAL LOW (ref 14–54)
ANION GAP: 9 (ref 5–15)
AST: 14 U/L — ABNORMAL LOW (ref 15–41)
Alkaline Phosphatase: 71 U/L (ref 38–126)
BILIRUBIN TOTAL: 0.9 mg/dL (ref 0.3–1.2)
BUN: 6 mg/dL (ref 6–20)
CHLORIDE: 105 mmol/L (ref 101–111)
CO2: 22 mmol/L (ref 22–32)
Calcium: 8.6 mg/dL — ABNORMAL LOW (ref 8.9–10.3)
Creatinine, Ser: 0.72 mg/dL (ref 0.44–1.00)
GFR calc Af Amer: >60 mL/min (ref >60)
GFR calc non Af Amer: >60 mL/min (ref >60)
GLUCOSE: 91 mg/dL (ref 65–99)
POTASSIUM: 3.4 mmol/L — AB (ref 3.5–5.1)
Sodium: 136 mmol/L (ref 135–145)
TOTAL PROTEIN: 7.8 g/dL (ref 6.5–8.1)

## 2016-10-28 LAB — CBC WITH DIFFERENTIAL/PLATELET
BASOS ABS: 0 10*3/uL (ref 0.0–0.1)
Basophils Relative: 0 %
Eosinophils Absolute: 0.1 10*3/uL (ref 0.0–0.7)
Eosinophils Relative: 0 %
HEMATOCRIT: 32.6 % — AB (ref 36.0–46.0)
Hemoglobin: 11 g/dL — ABNORMAL LOW (ref 12.0–15.0)
Lymphocytes Relative: 19 %
Lymphs Abs: 3.2 10*3/uL (ref 0.7–4.0)
MCH: 26.6 pg (ref 26.0–34.0)
MCHC: 33.7 g/dL (ref 30.0–36.0)
MCV: 78.7 fL (ref 78.0–100.0)
Monocytes Absolute: 1.9 10*3/uL — ABNORMAL HIGH (ref 0.1–1.0)
Monocytes Relative: 11 %
NEUTROS ABS: 12.1 10*3/uL — AB (ref 1.7–7.7)
Neutrophils Relative %: 70 %
Platelets: 215 10*3/uL (ref 150–400)
RBC: 4.14 MIL/uL (ref 3.87–5.11)
RDW: 16.6 % — ABNORMAL HIGH (ref 11.5–15.5)
WBC: 17.3 10*3/uL — AB (ref 4.0–10.5)

## 2016-10-28 LAB — I-STAT BETA HCG BLOOD, ED (MC, WL, AP ONLY): I-stat hCG, quantitative: 468 m[IU]/mL — ABNORMAL HIGH (ref <5)

## 2016-10-28 MED ORDER — LIDOCAINE-EPINEPHRINE (PF) 2 %-1:200000 IJ SOLN
20.0000 mL | Freq: Once | INTRAMUSCULAR | Status: AC
  Administered 2016-10-28: 20 mL
  Filled 2016-10-28 (×2): qty 20

## 2016-10-28 NOTE — ED Notes (Signed)
Wound dressed. Pt educated on care of wound at home. Pt verbalizes understanding.

## 2016-10-28 NOTE — ED Triage Notes (Addendum)
Pt complaint of RLQ pain at site of possible cellulitis/abscess. Redness/swelling noted at site. Pt states noted 10/23/16.

## 2016-10-28 NOTE — Discharge Instructions (Signed)
Change the dressing on your wound at least once per day to keep the area clean and dry. Take 500 mg of Tylenol every 6 hours as needed for pain control. Apply warm compresses to the area to promote drainage. Have your wound rechecked by a primary care doctor. You had a positive pregnancy test today. We advised you to see your OB/GYN for prenatal care.

## 2016-10-28 NOTE — ED Provider Notes (Signed)
[facility]-EMERGENCY DEPT Provider Note   CSN: [id number] Arrival date & time: 10/28/16  1348    History   Chief Complaint Chief Complaint  Patient presents with  . Abdominal Pain    HPI Patty Espinoza is a 26 y.o. female.  26 year old female percent to the emergency department for evaluation of abdominal pain. She states that abdominal pain correlates with site of increased redness and tenderness. She states that symptoms have been worsening over the past 5 days. No medications taken prior to arrival for symptoms. She has had some chills, but denies any known fever. No nausea, vomiting, diarrhea, urinary symptoms, vaginal bleeding, vaginal discharge. She states that she has tried squeezing the area to expel some drainage, but this was unsuccessful. No history of abdominal surgeries.   The history is provided by the patient. No language interpreter was used.    Past Medical History:  Diagnosis Date  . Medical history non-contributory     Patient Active Problem List   Diagnosis Date Noted  . Drug use affecting pregnancy, antepartum 08/12/2016  . Preterm premature rupture of membranes (PPROM) with unknown onset of labor 08/11/2016  . Fetal cardiac echogenic focus 06/12/2016  . Supervision of high risk pregnancy, antepartum 06/10/2016  . High risk pregnancy due to history of preterm labor, antepartum 06/10/2016    Past Surgical History:  Procedure Laterality Date  . NO PAST SURGERIES      OB History    Gravida Para Term Preterm AB Living   5 4 0 4 1 4    SAB TAB Ectopic Multiple Live Births   0 1 0 0 4       Home Medications    Prior to Admission medications   Medication Sig Start Date End Date Taking? Authorizing Provider  ibuprofen (ADVIL,MOTRIN) 600 MG tablet Take 1 tablet (600 mg total) by mouth every 6 (six) hours. Patient not taking: Reported on 10/28/2016 08/22/16   Rachelle A Denney, CNM  Iron-FA-B Cmp-C-Biot-Probiotic (FUSION PLUS) CAPS Take 1 tablet by mouth  daily. Patient not taking: Reported on 10/28/2016 08/22/16   Roe Coombs, CNM  oxyCODONE-acetaminophen (PERCOCET/ROXICET) 5-325 MG tablet Take 1-2 tablets by mouth every 4 (four) hours as needed (for pain scale equal to or greater than 7.). Patient not taking: Reported on 10/28/2016 08/22/16   Rachelle A Denney, CNM  senna-docusate (SENOKOT-S) 8.6-50 MG tablet Take 2 tablets by mouth 2 (two) times daily. Patient not taking: Reported on 10/28/2016 08/22/16   Roe Coombs, CNM    Family History Family History  Problem Relation Age of Onset  . Hypertension Mother   . Diabetes Father   . Hypertension Father   . Diabetes Maternal Grandmother   . Hypertension Maternal Grandmother   . Alcohol abuse Maternal Grandfather   . Arthritis Maternal Grandfather   . COPD Maternal Grandfather   . Diabetes Maternal Grandfather   . Hypertension Maternal Grandfather   . Hyperlipidemia Maternal Grandfather   . Vision loss Maternal Grandfather     Social History Social History  Substance Use Topics  . Smoking status: Former Smoker    Packs/day: 0.50    Years: 6.00    Types: Cigarettes    Quit date: 01/27/2016  . Smokeless tobacco: Never Used  . Alcohol use No     Comment: +THC on [facility] admission     Allergies   Latex   Review of Systems Review of Systems Ten systems reviewed and are negative for acute change, except as noted in the  HPI.    Physical Exam Updated Vital Signs BP 108/55   Pulse 77   Temp 98 F (36.7 C) (Oral)   Resp 18   Wt 61.7 kg   SpO2 100%   BMI 24.87 kg/m   Physical Exam  Constitutional: She is oriented to person, place, and time. She appears well-developed and well-nourished. No distress.  Nontoxic appearing and in no distress  HENT:  Head: Normocephalic and atraumatic.  Eyes: Conjunctivae and EOM are normal. No scleral icterus.  Neck: Normal range of motion.  Cardiovascular: Normal rate, regular rhythm and intact distal pulses.   Pulmonary/Chest:  Effort normal. No respiratory distress. She has no wheezes.  Respirations even and unlabored  Abdominal: Soft. She exhibits no distension and no mass. There is tenderness. There is no guarding.  Erythema with associated induration and central fluctuance noted to the abdominal wall. No purulent drainage. No other abdominal tenderness. No rigidity or peritoneal signs. See image below.  Musculoskeletal: Normal range of motion.  Neurological: She is alert and oriented to person, place, and time. She exhibits normal muscle tone. Coordination normal.  Skin: Skin is warm and dry. No rash noted. She is not diaphoretic. No erythema. No pallor.  Psychiatric: She has a normal mood and affect. Her behavior is normal.  Nursing note and vitals reviewed.    ED Treatments / Results  Labs (all labs ordered are listed, but only abnormal results are displayed) Labs Reviewed  COMPREHENSIVE METABOLIC PANEL - Abnormal; Notable for the following:       Result Value   Potassium 3.4 (*)    Calcium 8.6 (*)    AST 14 (*)    ALT 9 (*)    All other components within normal limits  CBC WITH DIFFERENTIAL/PLATELET - Abnormal; Notable for the following:    WBC 17.3 (*)    Hemoglobin 11.0 (*)    HCT 32.6 (*)    RDW 16.6 (*)    Neutro Abs 12.1 (*)    Monocytes Absolute 1.9 (*)    All other components within normal limits  I-STAT BETA HCG BLOOD, ED ([facility], [facility], [facility] ONLY) - Abnormal; Notable for the following:    I-stat hCG, quantitative 468.0 (*)    All other components within normal limits    EKG  EKG Interpretation None       Radiology No results found.  Procedures Procedures (including critical care time)  Medications Ordered in ED Medications  lidocaine-EPINEPHrine (XYLOCAINE W/EPI) 2 %-1:200000 (PF) injection 20 mL (20 mLs Infiltration Given 10/28/16 1806)     ^abdominal wall abscess    INCISION AND DRAINAGE Performed by: Antony Madura Consent: Verbal consent obtained. Risks and benefits:  risks, benefits and alternatives were discussed Type: abscess  Body area: abdominal wall  Anesthesia: local infiltration  Incision was made with a scalpel.  Local anesthetic: lidocaine 2% with epinephrine  Anesthetic total: 3 ml  Complexity: complex Blunt dissection to break up loculations  Drainage: purulent  Drainage amount: small-moderate  Packing material: none  Patient tolerance: Patient tolerated the procedure well with no immediate complications.    Initial Impression / Assessment and Plan / ED Course  I have reviewed the triage vital signs and the nursing notes.  Pertinent labs & imaging results that were available during my care of the patient were reviewed by me and considered in my medical decision making (see chart for details).  Clinical Course     26 year old female who is to the emergency department for evaluation  of abdominal pain. Pain correlates with an abscess to the abdominal wall. This was drained at the bedside without complications. No other evidence to suggest acute surgical abdomen. Patient was incidentally found to be pregnant while in the emergency department. She has no complaints of vaginal bleeding or discharge. I have encouraged her to follow-up with her OB/GYN for prenatal care, to discontinue tobacco and ETOH use, and to return for vaginal bleeding or worsening abdominal pain. Return precautions discussed and provided. Patient discharged in stable condition with no unaddressed concerns.   Final Clinical Impressions(s) / ED Diagnoses   Final diagnoses:  Abscess of abdominal wall  Positive pregnancy test    New Prescriptions Discharge Medication List as of 10/28/2016  6:57 PM       Antony MaduraKelly Ranesha Val, PA-C 10/28/16 2059    Lorre NickAnthony Allen, MD 10/28/16 2322

## 2017-04-29 ENCOUNTER — Encounter (HOSPITAL_COMMUNITY): Payer: Self-pay

## 2017-04-29 ENCOUNTER — Inpatient Hospital Stay (HOSPITAL_COMMUNITY)
Admission: AD | Admit: 2017-04-29 | Discharge: 2017-04-29 | Disposition: A | Discharge status: Home / Self Care | Payer: Medicaid Other | Source: Ambulatory Visit | Attending: Family Medicine | Admitting: Family Medicine

## 2017-04-29 DIAGNOSIS — O368130 Decreased fetal movements, third trimester, not applicable or unspecified: Secondary | ICD-10-CM | POA: Diagnosis not present

## 2017-04-29 DIAGNOSIS — O99013 Anemia complicating pregnancy, third trimester: Secondary | ICD-10-CM | POA: Diagnosis not present

## 2017-04-29 DIAGNOSIS — Z3689 Encounter for other specified antenatal screening: Secondary | ICD-10-CM

## 2017-04-29 DIAGNOSIS — R109 Unspecified abdominal pain: Secondary | ICD-10-CM | POA: Diagnosis not present

## 2017-04-29 DIAGNOSIS — D573 Sickle-cell trait: Secondary | ICD-10-CM | POA: Diagnosis not present

## 2017-04-29 DIAGNOSIS — Z87891 Personal history of nicotine dependence: Secondary | ICD-10-CM | POA: Diagnosis not present

## 2017-04-29 DIAGNOSIS — Z3A29 29 weeks gestation of pregnancy: Secondary | ICD-10-CM | POA: Insufficient documentation

## 2017-04-29 DIAGNOSIS — R102 Pelvic and perineal pain: Secondary | ICD-10-CM

## 2017-04-29 DIAGNOSIS — O26899 Other specified pregnancy related conditions, unspecified trimester: Secondary | ICD-10-CM

## 2017-04-29 HISTORY — DX: Sickle-cell trait: D57.3

## 2017-04-29 LAB — URINALYSIS, ROUTINE W REFLEX MICROSCOPIC
BILIRUBIN URINE: NEGATIVE
Glucose, UA: NEGATIVE mg/dL
Ketones, ur: NEGATIVE mg/dL
NITRITE: NEGATIVE
PH: 6 (ref 5.0–8.0)
Protein, ur: NEGATIVE mg/dL
SPECIFIC GRAVITY, URINE: 1.01 (ref 1.005–1.030)

## 2017-04-29 NOTE — MAU Note (Signed)
Urine in the lab  

## 2017-04-29 NOTE — MAU Provider Note (Signed)
History     CSN: [id number]  Arrival date and time: 04/29/17 [id number]   First Provider Initiated Contact with Patient 04/29/17 1739      Chief Complaint  Patient presents with  . Decreased Fetal Movement   HPI Patty Espinoza is a 26 y.o. [id number] at 4373w2d who presents with decreased fetal movement & abdominal cramping. Symptoms began yesterday. Reports no fetal movement since last night at 10 pm. Abdominal cramping & intermittent sharp pains for the last 2 days. LLQ sharp pains with walking and movement. Also reports some abdominal cramping & abdominal tightening that occurs 3-4 times per hour. Denies vaginal bleeding or LOF. No recent intercourse. [place] High risk d/t hx of PTD x 4.   OB History    Gravida Para Term Preterm AB Living   7 4 0 4 2 4    SAB TAB Ectopic Multiple Live Births   1 1 0 0 4      Past Medical History:  Diagnosis Date  . Preterm labor   . Sickle cell trait [facility](H[facility])     Past Surgical History:  Procedure Laterality Date  . NO PAST SURGERIES      Family History  Problem Relation Age of Onset  . Hypertension Mother   . Diabetes Father   . Hypertension Father   . Diabetes Maternal Grandmother   . Hypertension Maternal Grandmother   . Alcohol abuse Maternal Grandfather   . Arthritis Maternal Grandfather   . COPD Maternal Grandfather   . Diabetes Maternal Grandfather   . Hypertension Maternal Grandfather   . Hyperlipidemia Maternal Grandfather   . Vision loss Maternal Grandfather     Social History  Substance Use Topics  . Smoking status: Former Smoker    Packs/day: 0.50    Years: 6.00    Types: Cigarettes    Quit date: 01/27/2016  . Smokeless tobacco: Never Used  . Alcohol use No     Comment: +THC on UDS admission    Allergies:  Allergies  Allergen Reactions  . Latex Rash    Prescriptions Prior to Admission  Medication Sig Dispense Refill Last Dose  . ibuprofen (ADVIL,MOTRIN) 600 MG tablet Take 1 tablet (600 mg  total) by mouth every 6 (six) hours. (Patient not taking: Reported on 10/28/2016) 120 tablet 2 Completed Course at Unknown time  . Iron-FA-B Cmp-C-Biot-Probiotic (FUSION PLUS) CAPS Take 1 tablet by mouth daily. (Patient not taking: Reported on 10/28/2016) 30 capsule 2 Not Taking at Unknown time  . oxyCODONE-acetaminophen (PERCOCET/ROXICET) 5-325 MG tablet Take 1-2 tablets by mouth every 4 (four) hours as needed (for pain scale equal to or greater than 7.). (Patient not taking: Reported on 10/28/2016) 45 tablet 0 Not Taking at Unknown time  . senna-docusate (SENOKOT-S) 8.6-50 MG tablet Take 2 tablets by mouth 2 (two) times daily. (Patient not taking: Reported on 10/28/2016) 90 tablet 2 Not Taking at Unknown time    Review of Systems  Constitutional: Negative.   Gastrointestinal: Positive for abdominal pain. Negative for diarrhea, nausea and vomiting.  Genitourinary: Negative.    Physical Exam   Blood pressure (!) 104/50, pulse 80, temperature 97.9 F (36.6 C), temperature source Oral, resp. rate 16, height 5\' 3"  (1.6 m), weight 150 lb (68 kg), unknown if currently breastfeeding.  Physical Exam  Nursing note and vitals reviewed. Constitutional: She is oriented to person, place, and time. She appears well-developed and well-nourished. No distress.  HENT:  Head: Normocephalic and atraumatic.  Eyes: Conjunctivae are normal.  Right eye exhibits no discharge. Left eye exhibits no discharge. No scleral icterus.  Neck: Normal range of motion.  Respiratory: Effort normal. No respiratory distress.  GI: Soft. There is no tenderness.  Neurological: She is alert and oriented to person, place, and time.  Skin: Skin is warm and dry. She is not diaphoretic.  Psychiatric: She has a normal mood and affect. Her behavior is normal. Judgment and thought content normal.   Dilation: Closed Effacement (%): Thick Cervical Position: Posterior Exam by:: E Gladys Gutman  Fetal Tracing:  Baseline: 135 Variability:  moderate Accelerations: 10x10 Decelerations: none  Toco: none  MAU Course  Procedures Results for orders placed or performed during the hospital encounter of 04/29/17 (from the past 24 hour(s))  Urinalysis, Routine w reflex microscopic     Status: Abnormal   Collection Time: 04/29/17  4:40 PM  Result Value Ref Range   Color, Urine STRAW (A) YELLOW   APPearance CLEAR CLEAR   Specific Gravity, Urine 1.010 1.005 - 1.030   pH 6.0 5.0 - 8.0   Glucose, UA NEGATIVE NEGATIVE mg/dL   Hgb urine dipstick SMALL (A) NEGATIVE   Bilirubin Urine NEGATIVE NEGATIVE   Ketones, ur NEGATIVE NEGATIVE mg/dL   Protein, ur NEGATIVE NEGATIVE mg/dL   Nitrite NEGATIVE NEGATIVE   Leukocytes, UA SMALL (A) NEGATIVE   RBC / HPF 0-5 0 - 5 RBC/hpf   WBC, UA 0-5 0 - 5 WBC/hpf   Bacteria, UA RARE (A) NONE SEEN   Squamous Epithelial / LPF 6-30 (A) NONE SEEN    MDM Reactive NST -- fetal movement audible & palpable No ctx on toco; cervix closed/thick/high Pt reassured by tracing & closed cervix & ready to be discharged home Assessment and Plan  A: 1. Decreased fetal movements in third trimester, single or unspecified fetus   2. NST (non-stress test) reactive   3. Abdominal cramping affecting pregnancy   4. Pain of round ligament during pregnancy    P: Discharge home F/u with OB in [place] Discussed reasons to return to MAU  Judeth Horn 04/29/2017, 5:44 PM

## 2017-04-29 NOTE — MAU Note (Signed)
Has not felt baby move since 2200 last night. No bleeding. Has some mucous discharge. No LOF

## 2017-04-29 NOTE — Discharge Instructions (Signed)
Preterm Labor and Birth Information The normal length of a pregnancy is 39-41 weeks. Preterm labor is when labor starts before 37 completed weeks of pregnancy. What are the risk factors for preterm labor? Preterm labor is more likely to occur in women who:  Have certain infections during pregnancy such as a bladder infection, sexually transmitted infection, or infection inside the uterus (chorioamnionitis).  Have a shorter-than-normal cervix.  Have gone into preterm labor before.  Have had surgery on their cervix.  Are younger than age 89 or older than age 19.  Are African American.  Are pregnant with twins or multiple babies (multiple gestation).  Take street drugs or smoke while pregnant.  Do not gain enough weight while pregnant.  Became pregnant shortly after having been pregnant.  What are the symptoms of preterm labor? Symptoms of preterm labor include:  Cramps similar to those that can happen during a menstrual period. The cramps may happen with diarrhea.  Pain in the abdomen or lower back.  Regular uterine contractions that may feel like tightening of the abdomen.  A feeling of increased pressure in the pelvis.  Increased watery or bloody mucus discharge from the vagina.  Water breaking (ruptured amniotic sac).  Why is it important to recognize signs of preterm labor? It is important to recognize signs of preterm labor because babies who are born prematurely may not be fully developed. This can put them at an increased risk for:  Long-term (chronic) heart and lung problems.  Difficulty immediately after birth with regulating body systems, including blood sugar, body temperature, heart rate, and breathing rate.  Bleeding in the brain.  Cerebral palsy.  Learning difficulties.  Death.  These risks are highest for babies who are born before 37 weeks of pregnancy. How is preterm labor treated? Treatment depends on the length of your pregnancy, your  condition, and the health of your baby. It may involve:  Having a stitch (suture) placed in your cervix to prevent your cervix from opening too early (cerclage).  Taking or being given medicines, such as: ? Hormone medicines. These may be given early in pregnancy to help support the pregnancy. ? Medicine to stop contractions. ? Medicines to help mature the babys lungs. These may be prescribed if the risk of delivery is high. ? Medicines to prevent your baby from developing cerebral palsy.  If the labor happens before 34 weeks of pregnancy, you may need to stay in the hospital. What should I do if I think I am in preterm labor? If you think that you are going into preterm labor, call your health care provider right away. How can I prevent preterm labor in future pregnancies? To increase your chance of having a full-term pregnancy:  Do not use any tobacco products, such as cigarettes, chewing tobacco, and e-cigarettes. If you need help quitting, ask your health care provider.  Do not use street drugs or medicines that have not been prescribed to you during your pregnancy.  Talk with your health care provider before taking any herbal supplements, even if you have been taking them regularly.  Make sure you gain a healthy amount of weight during your pregnancy.  Watch for infection. If you think that you might have an infection, get it checked right away.  Make sure to tell your health care provider if you have gone into preterm labor before.  This information is not intended to replace advice given to you by your health care provider. Make sure you discuss any questions  you have with your health care provider. Document Released: 12/21/2003 Document Revised: 03/12/2016 Document Reviewed: 02/21/2016 Elsevier Interactive Patient Education  2018 [employer] Fetal Movement Counts Patient Name: ________________________________________________ Patient Due Date: ____________________ What is  a fetal movement count? A fetal movement count is the number of times that you feel your baby move during a certain amount of time. This may also be called a fetal kick count. A fetal movement count is recommended for every pregnant woman. You may be asked to start counting fetal movements as early as week 28 of your pregnancy. Pay attention to when your baby is most active. You may notice your baby's sleep and wake cycles. You may also notice things that make your baby move more. You should do a fetal movement count:  When your baby is normally most active.  At the same time each day.  A good time to count movements is while you are resting, after having something to eat and drink. How do I count fetal movements? 1. Find a quiet, comfortable area. Sit, or lie down on your side. 2. Write down the date, the start time and stop time, and the number of movements that you felt between those two times. Take this information with you to your health care visits. 3. For 2 hours, count kicks, flutters, swishes, rolls, and jabs. You should feel at least 10 movements during 2 hours. 4. You may stop counting after you have felt 10 movements. 5. If you do not feel 10 movements in 2 hours, have something to eat and drink. Then, keep resting and counting for 1 hour. If you feel at least 4 movements during that hour, you may stop counting. Contact a health care provider if:  You feel fewer than 4 movements in 2 hours.  Your baby is not moving like he or she usually does. Date: ____________ Start time: ____________ Stop time: ____________ Movements: ____________ Date: ____________ Start time: ____________ Stop time: ____________ Movements: ____________ Date: ____________ Start time: ____________ Stop time: ____________ Movements: ____________ Date: ____________ Start time: ____________ Stop time: ____________ Movements: ____________ Date: ____________ Start time: ____________ Stop time: ____________  Movements: ____________ Date: ____________ Start time: ____________ Stop time: ____________ Movements: ____________ Date: ____________ Start time: ____________ Stop time: ____________ Movements: ____________ Date: ____________ Start time: ____________ Stop time: ____________ Movements: ____________ Date: ____________ Start time: ____________ Stop time: ____________ Movements: ____________ This information is not intended to replace advice given to you by your health care provider. Make sure you discuss any questions you have with your health care provider. Document Released: 10/30/2006 Document Revised: 05/29/2016 Document Reviewed: 11/09/2015 Elsevier Interactive Patient Education  2018 [employer]. Round Ligament Pain The round ligament is a cord of muscle and tissue that helps to support the uterus. It can become a source of pain during pregnancy if it becomes stretched or twisted as the baby grows. The pain usually begins in the second trimester of pregnancy, and it can come and go until the baby is delivered. It is not a serious problem, and it does not cause harm to the baby. Round ligament pain is usually a short, sharp, and pinching pain, but it can also be a dull, lingering, and aching pain. The pain is felt in the lower side of the abdomen or in the groin. It usually starts deep in the groin and moves up to the outside of the hip area. Pain can occur with:  A sudden change in position.  Rolling over in bed.  Coughing or sneezing.  Physical activity.  Follow these instructions at home: Watch your condition for any changes. Take these steps to help with your pain:  When the pain starts, relax. Then try: ? Sitting down. ? Flexing your knees up to your abdomen. ? Lying on your side with one pillow under your abdomen and another pillow between your legs. ? Sitting in a warm bath for 15-20 minutes or until the pain goes away.  Take over-the-counter and prescription medicines only  as told by your health care provider.  Move slowly when you sit and stand.  Avoid long walks if they cause pain.  Stop or lessen your physical activities if they cause pain.  Contact a health care provider if:  Your pain does not go away with treatment.  You feel pain in your back that you did not have before.  Your medicine is not helping. Get help right away if:  You develop a fever or chills.  You develop uterine contractions.  You develop vaginal bleeding.  You develop nausea or vomiting.  You develop diarrhea.  You have pain when you urinate. This information is not intended to replace advice given to you by your health care provider. Make sure you discuss any questions you have with your health care provider. Document Released: 07/09/2008 Document Revised: 03/07/2016 Document Reviewed: 12/07/2014 Elsevier Interactive Patient Education  [employer]2018 [employer]

## 2017-05-07 ENCOUNTER — Encounter (HOSPITAL_COMMUNITY): Payer: Self-pay | Admitting: *Deleted

## 2017-05-07 ENCOUNTER — Inpatient Hospital Stay (HOSPITAL_COMMUNITY)
Admission: AD | Admit: 2017-05-07 | Discharge: 2017-05-07 | Disposition: A | Discharge status: Home / Self Care | Payer: Medicaid Other | Source: Ambulatory Visit | Attending: Obstetrics & Gynecology | Admitting: Obstetrics & Gynecology

## 2017-05-07 DIAGNOSIS — Z3A30 30 weeks gestation of pregnancy: Secondary | ICD-10-CM | POA: Insufficient documentation

## 2017-05-07 DIAGNOSIS — O09213 Supervision of pregnancy with history of pre-term labor, third trimester: Secondary | ICD-10-CM

## 2017-05-07 DIAGNOSIS — O4693 Antepartum hemorrhage, unspecified, third trimester: Secondary | ICD-10-CM | POA: Diagnosis present

## 2017-05-07 DIAGNOSIS — N939 Abnormal uterine and vaginal bleeding, unspecified: Secondary | ICD-10-CM

## 2017-05-07 DIAGNOSIS — D573 Sickle-cell trait: Secondary | ICD-10-CM | POA: Insufficient documentation

## 2017-05-07 DIAGNOSIS — O26853 Spotting complicating pregnancy, third trimester: Secondary | ICD-10-CM | POA: Insufficient documentation

## 2017-05-07 DIAGNOSIS — Z87891 Personal history of nicotine dependence: Secondary | ICD-10-CM | POA: Diagnosis not present

## 2017-05-07 DIAGNOSIS — O0933 Supervision of pregnancy with insufficient antenatal care, third trimester: Secondary | ICD-10-CM | POA: Diagnosis not present

## 2017-05-07 DIAGNOSIS — O368130 Decreased fetal movements, third trimester, not applicable or unspecified: Secondary | ICD-10-CM

## 2017-05-07 DIAGNOSIS — O4703 False labor before 37 completed weeks of gestation, third trimester: Secondary | ICD-10-CM

## 2017-05-07 DIAGNOSIS — O99013 Anemia complicating pregnancy, third trimester: Secondary | ICD-10-CM | POA: Insufficient documentation

## 2017-05-07 DIAGNOSIS — O09893 Supervision of other high risk pregnancies, third trimester: Secondary | ICD-10-CM

## 2017-05-07 LAB — URINALYSIS, ROUTINE W REFLEX MICROSCOPIC
Bilirubin Urine: NEGATIVE
Glucose, UA: NEGATIVE mg/dL
Ketones, ur: NEGATIVE mg/dL
NITRITE: NEGATIVE
PH: 7 (ref 5.0–8.0)
Protein, ur: NEGATIVE mg/dL
SPECIFIC GRAVITY, URINE: 1.008 (ref 1.005–1.030)

## 2017-05-07 LAB — AMNISURE RUPTURE OF MEMBRANE (ROM) NOT AT ARMC: AMNISURE: NEGATIVE

## 2017-05-07 LAB — WET PREP, GENITAL
CLUE CELLS WET PREP: NONE SEEN
SPERM: NONE SEEN
Trich, Wet Prep: NONE SEEN
YEAST WET PREP: NONE SEEN

## 2017-05-07 MED ORDER — BETAMETHASONE SOD PHOS & ACET 6 (3-3) MG/ML IJ SUSP
12.0000 mg | Freq: Once | INTRAMUSCULAR | Status: AC
  Administered 2017-05-07: 12 mg via INTRAMUSCULAR
  Filled 2017-05-07: qty 2

## 2017-05-07 NOTE — MAU Provider Note (Signed)
Chief Complaint:  Vaginal Bleeding   None     HPI: Patty Espinoza is a 26 y.o. [id number] at 7328w3d who presents to maternity admissions reporting onset of vaginal bleeding today. The bleeding started light, mixed with mucous, then became blood only but still light. She has not required a pad for her bleeding.  She has hx significant for preterm vaginal delivery x 4, at 36w, 34 w, and 29w x 2, last delivery 08/2016. She started prenatal care 2 weeks ago in [place] so was too late to care to start 17P injections.  Records are available in Care Everywhere including anatomy US.  She was treated for chlamydia prior to her initial OB visit but no TOC documented.  The bleeding is associated with increased wetness in her underwear but not large leaking requiring a pad. She also reports irregular cramping but no regular contractions.  There are no other associated symptoms. She has not tried any treatments. She reports good fetal movement, denies vaginal itching/burning, urinary symptoms, h/a, dizziness, n/v, or fever/chills.    HPI  Past Medical History: Past Medical History:  Diagnosis Date  . Preterm labor   . Sickle cell trait (HCC)     Past obstetric history: OB History  Gravida Para Term Preterm AB Living  7 4 0 4 2 4   SAB TAB Ectopic Multiple Live Births  1 1 0 0 4    # Outcome Date GA Lbr Len/2nd Weight Sex Delivery Anes PTL Lv  7 Current           6 Preterm 08/20/16 2547w2d / 00:11 3 lb 3.5 oz (1.46 kg) M Vag-Spont None  LIV  5 TAB 06/29/15 2464w0d         4 Preterm 08/08/14 7973w0d  2 lb 9 oz (1.162 kg) M Vag-Spont None Y LIV  3 Preterm 11/03/13 8927w0d  6 lb 14 oz (3.118 kg) M Vag-Spont  Y LIV  2 Preterm 07/23/11 3553w0d  6 lb 5 oz (2.863 kg) M Vag-Spont EPI Y LIV  1 SAB               Past Surgical History: Past Surgical History:  Procedure Laterality Date  . NO PAST SURGERIES      Family History: Family History  Problem Relation Age of Onset  . Hypertension Mother   . Diabetes Father    . Hypertension Father   . Diabetes Maternal Grandmother   . Hypertension Maternal Grandmother   . Alcohol abuse Maternal Grandfather   . Arthritis Maternal Grandfather   . COPD Maternal Grandfather   . Diabetes Maternal Grandfather   . Hypertension Maternal Grandfather   . Hyperlipidemia Maternal Grandfather   . Vision loss Maternal Grandfather     Social History: Social History  Substance Use Topics  . Smoking status: Former Smoker    Packs/day: 0.50    Years: 6.00    Types: Cigarettes    Quit date: 01/27/2016  . Smokeless tobacco: Never Used  . Alcohol use No     Comment: +THC on UDS admission    Allergies:  Allergies  Allergen Reactions  . Latex Rash    Meds:  No prescriptions prior to admission.    ROS:  Review of Systems  Constitutional: Negative for chills, fatigue and fever.  Eyes: Negative for visual disturbance.  Respiratory: Negative for shortness of breath.   Cardiovascular: Negative for chest pain.  Gastrointestinal: Negative for abdominal pain, nausea and vomiting.  Genitourinary: Positive for vaginal bleeding and vaginal  discharge. Negative for difficulty urinating, dysuria, flank pain, pelvic pain and vaginal pain.  Neurological: Negative for dizziness and headaches.  Psychiatric/Behavioral: Negative.      I have reviewed patient's Past Medical Hx, Surgical Hx, Family Hx, Social Hx, medications and allergies.   Physical Exam   Patient Vitals for the past 24 hrs:  BP Temp Temp src Pulse Resp SpO2  05/07/17 1352 - 98.7 F (37.1 C) Oral 77 20 100 %  05/07/17 1003 116/66 97.8 F (36.6 C) - 81 18 99 %   Constitutional: Well-developed, well-nourished female in no acute distress.  Cardiovascular: normal rate Respiratory: normal effort GI: Abd soft, non-tender, gravid appropriate for gestational age.  MS: Extremities nontender, no edema, normal ROM Neurologic: Alert and oriented x 4.  GU: Neg CVAT.  PELVIC EXAM: Cervix pink, visually closed,  without lesion, scant brown bleeding noted, no active bleeding, vaginal walls and external genitalia normal  Dilation: 1.5 (per Sharen Counter, CNM) Effacement (%): Thick Cervical Position: Middle Presentation: Vertex Trace blood on glove following exam  Cervix rechecked in 2 hours: Dilation: 1.5 (per Sharen Counter, CNM) Effacement (%): Thick Cervical Position: Middle Presentation: Vertex  FHT:  Baseline 135 , moderate variability, accelerations present, no decelerations Contractions: None on toco or to palpation   Labs: Results for orders placed or performed during the hospital encounter of 05/07/17 (from the past 24 hour(s))  Urinalysis, Routine w reflex microscopic     Status: Abnormal   Collection Time: 05/07/17  9:59 AM  Result Value Ref Range   Color, Urine STRAW (A) YELLOW   APPearance CLEAR CLEAR   Specific Gravity, Urine 1.008 1.005 - 1.030   pH 7.0 5.0 - 8.0   Glucose, UA NEGATIVE NEGATIVE mg/dL   Hgb urine dipstick MODERATE (A) NEGATIVE   Bilirubin Urine NEGATIVE NEGATIVE   Ketones, ur NEGATIVE NEGATIVE mg/dL   Protein, ur NEGATIVE NEGATIVE mg/dL   Nitrite NEGATIVE NEGATIVE   Leukocytes, UA LARGE (A) NEGATIVE   RBC / HPF 0-5 0 - 5 RBC/hpf   WBC, UA 6-30 0 - 5 WBC/hpf   Bacteria, UA RARE (A) NONE SEEN   Squamous Epithelial / LPF 0-5 (A) NONE SEEN   Mucous PRESENT   Amnisure rupture of membrane (rom)not at [facility]     Status: None   Collection Time: 05/07/17 10:30 AM  Result Value Ref Range   Amnisure ROM NEGATIVE   Wet prep, genital     Status: Abnormal   Collection Time: 05/07/17 11:15 AM  Result Value Ref Range   Yeast Wet Prep HPF POC NONE SEEN NONE SEEN   Trich, Wet Prep NONE SEEN NONE SEEN   Clue Cells Wet Prep HPF POC NONE SEEN NONE SEEN   WBC, Wet Prep HPF POC MANY (A) NONE SEEN   Sperm NONE SEEN    --/--/O POS (11/07 4010)  Imaging:  No results found.  MAU Course/MDM: I have ordered labs and reviewed results.  NST reviewed and  reactive No evidence of preterm labor today and vaginal bleeding minimal, c/w mucous plug BMZ dose given today in MAU due to history of preterm delivery x 4 and second dose scheduled at [facility] tomorrow Consult Anyanwu with presentation, exam findings and test results.  Pt to follow up with outpatient anatomy US and appointment in [facility] as soon as available for prenatal care Records from [facility] requested  Pt stable at time of discharge.  Today's evaluation included a work-up for preterm labor which can be life-threatening  for both mom and baby.  Assessment: 1. Vaginal spotting   2. Threatened preterm labor, third trimester   3. Hx of preterm delivery, currently pregnant, third trimester   4. [redacted] weeks gestation of pregnancy   5. Late prenatal care affecting pregnancy in third trimester     Plan: Discharge home with bleeding and preterm labor precautions  Follow-up Information    [facility]   Specialty:  Obstetrics and Gynecology Why:  Go to the office at 11:00 am tomorrow, 05/08/17 for steroid shot.  The office will call you with prenatal appointment. Maternal Fetal Medicine will also call you with an ultrasound appointment.  Return to MAU as needed for emergencies. Contact information: [address] [place] [id number] [phone]         Allergies as of 05/07/2017      Reactions   Latex Rash      Medication List    You have not been prescribed any medications.     Sharen CounterLisa Leftwich-Kirby Certified Nurse-Midwife 05/07/2017 6:28 PM

## 2017-05-07 NOTE — MAU Note (Signed)
Pt presents to MAU with complaints of vaginal bleeding that started this morning. Denies any abnormal discharge. Hx of 4 preterm deliveries

## 2017-05-07 NOTE — MAU Note (Signed)
Bleeding noted when she wiped and later noticed it was on her panties.  " I woke up around 0700 and there was some on my bed and increasing on my pants."

## 2017-05-08 ENCOUNTER — Ambulatory Visit (INDEPENDENT_AMBULATORY_CARE_PROVIDER_SITE_OTHER): Payer: Medicaid Other | Admitting: General Practice

## 2017-05-08 ENCOUNTER — Telehealth: Payer: Self-pay | Admitting: Family Medicine

## 2017-05-08 ENCOUNTER — Inpatient Hospital Stay (HOSPITAL_COMMUNITY)
Admission: AD | Admit: 2017-05-08 | Discharge: 2017-05-08 | Disposition: A | Discharge status: Other Acute Inpatient Hospital | Payer: Medicaid Other | Source: Ambulatory Visit | Attending: Obstetrics and Gynecology | Admitting: Obstetrics and Gynecology

## 2017-05-08 LAB — GC/CHLAMYDIA PROBE AMP (~~LOC~~) NOT AT ARMC
Chlamydia: NEGATIVE
Neisseria Gonorrhea: NEGATIVE

## 2017-05-08 MED ORDER — BETAMETHASONE SOD PHOS & ACET 6 (3-3) MG/ML IJ SUSP
12.0000 mg | Freq: Once | INTRAMUSCULAR | Status: AC
  Administered 2017-05-08: 12 mg via INTRAMUSCULAR

## 2017-05-08 NOTE — Telephone Encounter (Signed)
Called patient due to having to change appointment date, due to fellow not being able to see patient per medicaid coverage.

## 2017-05-12 ENCOUNTER — Ambulatory Visit (HOSPITAL_COMMUNITY)
Admission: RE | Admit: 2017-05-12 | Discharge: 2017-05-12 | Disposition: A | Discharge status: Home / Self Care | Payer: Medicaid Other | Source: Ambulatory Visit | Attending: Advanced Practice Midwife | Admitting: Advanced Practice Midwife

## 2017-05-12 DIAGNOSIS — Z3A31 31 weeks gestation of pregnancy: Secondary | ICD-10-CM | POA: Insufficient documentation

## 2017-05-12 DIAGNOSIS — O0933 Supervision of pregnancy with insufficient antenatal care, third trimester: Secondary | ICD-10-CM | POA: Diagnosis present

## 2017-05-12 DIAGNOSIS — O09213 Supervision of pregnancy with history of pre-term labor, third trimester: Secondary | ICD-10-CM | POA: Insufficient documentation

## 2017-05-12 DIAGNOSIS — Z3A30 30 weeks gestation of pregnancy: Secondary | ICD-10-CM

## 2017-05-12 DIAGNOSIS — O09893 Supervision of other high risk pregnancies, third trimester: Secondary | ICD-10-CM | POA: Diagnosis not present

## 2017-05-14 ENCOUNTER — Encounter (HOSPITAL_COMMUNITY): Payer: Self-pay | Admitting: *Deleted

## 2017-05-14 ENCOUNTER — Inpatient Hospital Stay (HOSPITAL_COMMUNITY)
Admission: AD | Admit: 2017-05-14 | Discharge: 2017-05-21 | DRG: 775 | Disposition: A | Discharge status: Home / Self Care | Payer: Medicaid Other | Source: Ambulatory Visit | Attending: Obstetrics & Gynecology | Admitting: Obstetrics & Gynecology

## 2017-05-14 DIAGNOSIS — O42113 Preterm premature rupture of membranes, onset of labor more than 24 hours following rupture, third trimester: Secondary | ICD-10-CM | POA: Diagnosis not present

## 2017-05-14 DIAGNOSIS — O0990 Supervision of high risk pregnancy, unspecified, unspecified trimester: Secondary | ICD-10-CM

## 2017-05-14 DIAGNOSIS — O0930 Supervision of pregnancy with insufficient antenatal care, unspecified trimester: Secondary | ICD-10-CM

## 2017-05-14 DIAGNOSIS — O321XX0 Maternal care for breech presentation, not applicable or unspecified: Secondary | ICD-10-CM | POA: Diagnosis present

## 2017-05-14 DIAGNOSIS — Z3A31 31 weeks gestation of pregnancy: Secondary | ICD-10-CM

## 2017-05-14 DIAGNOSIS — Z87891 Personal history of nicotine dependence: Secondary | ICD-10-CM

## 2017-05-14 DIAGNOSIS — Z3A32 32 weeks gestation of pregnancy: Secondary | ICD-10-CM | POA: Diagnosis not present

## 2017-05-14 DIAGNOSIS — D573 Sickle-cell trait: Secondary | ICD-10-CM | POA: Diagnosis present

## 2017-05-14 DIAGNOSIS — O9902 Anemia complicating childbirth: Secondary | ICD-10-CM | POA: Diagnosis present

## 2017-05-14 DIAGNOSIS — O4290 Premature rupture of membranes, unspecified as to length of time between rupture and onset of labor, unspecified weeks of gestation: Secondary | ICD-10-CM | POA: Diagnosis present

## 2017-05-14 DIAGNOSIS — O42913 Preterm premature rupture of membranes, unspecified as to length of time between rupture and onset of labor, third trimester: Secondary | ICD-10-CM | POA: Diagnosis present

## 2017-05-14 DIAGNOSIS — O99824 Streptococcus B carrier state complicating childbirth: Secondary | ICD-10-CM | POA: Diagnosis present

## 2017-05-14 DIAGNOSIS — O24419 Gestational diabetes mellitus in pregnancy, unspecified control: Secondary | ICD-10-CM | POA: Diagnosis not present

## 2017-05-14 DIAGNOSIS — O24420 Gestational diabetes mellitus in childbirth, diet controlled: Secondary | ICD-10-CM | POA: Diagnosis present

## 2017-05-14 DIAGNOSIS — B951 Streptococcus, group B, as the cause of diseases classified elsewhere: Secondary | ICD-10-CM | POA: Diagnosis not present

## 2017-05-14 HISTORY — DX: Anemia, unspecified: D64.9

## 2017-05-14 HISTORY — DX: Gastro-esophageal reflux disease without esophagitis: K21.9

## 2017-05-14 LAB — CBC
HEMATOCRIT: 26.5 % — AB (ref 36.0–46.0)
HEMOGLOBIN: 9 g/dL — AB (ref 12.0–15.0)
MCH: 27.4 pg (ref 26.0–34.0)
MCHC: 34 g/dL (ref 30.0–36.0)
MCV: 80.5 fL (ref 78.0–100.0)
Platelets: 233 10*3/uL (ref 150–400)
RBC: 3.29 MIL/uL — ABNORMAL LOW (ref 3.87–5.11)
RDW: 14.1 % (ref 11.5–15.5)
WBC: 13.7 10*3/uL — ABNORMAL HIGH (ref 4.0–10.5)

## 2017-05-14 LAB — OB RESULTS CONSOLE GBS: GBS: POSITIVE

## 2017-05-14 MED ORDER — AZITHROMYCIN 500 MG IV SOLR
500.0000 mg | INTRAVENOUS | Status: AC
  Administered 2017-05-14 – 2017-05-15 (×2): 500 mg via INTRAVENOUS
  Filled 2017-05-14 (×2): qty 500

## 2017-05-14 MED ORDER — CALCIUM CARBONATE ANTACID 500 MG PO CHEW: 2.0000 | CHEWABLE_TABLET | ORAL | Status: DC | PRN

## 2017-05-14 MED ORDER — ZOLPIDEM TARTRATE 5 MG PO TABS: 5.0000 mg | ORAL_TABLET | Freq: Every evening | ORAL | Status: DC | PRN

## 2017-05-14 MED ORDER — AMOXICILLIN 500 MG PO CAPS
500.0000 mg | ORAL_CAPSULE | Freq: Three times a day (TID) | ORAL | Status: DC
  Administered 2017-05-16 – 2017-05-19 (×10): 500 mg via ORAL
  Filled 2017-05-14 (×13): qty 1

## 2017-05-14 MED ORDER — DOCUSATE SODIUM 100 MG PO CAPS
100.0000 mg | ORAL_CAPSULE | Freq: Every day | ORAL | Status: DC
  Administered 2017-05-15 – 2017-05-18 (×2): 100 mg via ORAL
  Filled 2017-05-14 (×6): qty 1

## 2017-05-14 MED ORDER — PRENATAL MULTIVITAMIN CH
1.0000 | ORAL_TABLET | Freq: Every day | ORAL | Status: DC
  Administered 2017-05-15 – 2017-05-19 (×5): 1 via ORAL
  Filled 2017-05-14 (×6): qty 1

## 2017-05-14 MED ORDER — SODIUM CHLORIDE 0.9 % IV SOLN
2.0000 g | Freq: Four times a day (QID) | INTRAVENOUS | Status: AC
  Administered 2017-05-14 – 2017-05-16 (×6): 2 g via INTRAVENOUS
  Filled 2017-05-14 (×8): qty 2000

## 2017-05-14 MED ORDER — LACTATED RINGERS IV SOLN
INTRAVENOUS | Status: DC
  Administered 2017-05-14 – 2017-05-15 (×2): via INTRAVENOUS

## 2017-05-14 MED ORDER — ACETAMINOPHEN 325 MG PO TABS
650.0000 mg | ORAL_TABLET | ORAL | Status: DC | PRN
  Administered 2017-05-18: 650 mg via ORAL
  Filled 2017-05-14: qty 2

## 2017-05-14 MED ORDER — AZITHROMYCIN 250 MG PO TABS
500.0000 mg | ORAL_TABLET | Freq: Every day | ORAL | Status: DC
Start: 2017-05-16 — End: 2017-05-20
  Administered 2017-05-16 – 2017-05-19 (×4): 500 mg via ORAL
  Filled 2017-05-14 (×4): qty 2

## 2017-05-14 NOTE — MAU Note (Signed)
Pt sat down on the toilet to void and immediately had a gush of fluid come out and when she looked down she noticed it was red. She put on a pad and has continued to see  pink fluid  fill the pads. Denies any pain and feels the baby moving well. Says all her babies were premature due her water breaking early.

## 2017-05-14 NOTE — H&P (Signed)
Patty Espinoza is a 26 y.o. female [phone] at 31.3 weeks presenting for spontaneous rupture of membranes. She states at 1900 she had a large gush of clear fluid and has continued to leak pink tinged fluid since. She denies any pain or contractions. Reports good fetal movement. She has hx significant for preterm vaginal delivery x 4, at 36w, 34 w, and 29w x 2, last delivery 08/2016. She started prenatal care 2 weeks ago in [place] so was too late to care to start 17P injections.  Records are available in Care Everywhere including anatomy US.  She was treated for chlamydia prior to her initial OB visit and was negative on 7/25. She received BMZ on 7/25 and 7/26.   OB History    Gravida Para Term Preterm AB Living   7 4 0 4 2 4    SAB TAB Ectopic Multiple Live Births   1 1 0 0 4     Past Medical History:  Diagnosis Date  . Anemia   . GERD (gastroesophageal reflux disease)   . Preterm labor   . Sickle cell trait [facility](HCC)    Past Surgical History:  Procedure Laterality Date  . NO PAST SURGERIES     Family History: family history includes Alcohol abuse in her maternal grandfather; Arthritis in her maternal grandfather; COPD in her maternal grandfather; Diabetes in her father, maternal grandfather, and maternal grandmother; Hyperlipidemia in her maternal grandfather; Hypertension in her father, maternal grandfather, maternal grandmother, and mother; Vision loss in her maternal grandfather. Social History:  reports that she quit smoking about 15 months ago. Her smoking use included Cigarettes. She has a 3.00 pack-year smoking history. She has never used smokeless tobacco. She reports that she does not drink alcohol or use drugs.    Maternal Diabetes: No Genetic Screening: Declined Maternal Ultrasounds/Referrals: Normal Fetal Ultrasounds or other Referrals:  None Maternal Substance Abuse:  No Significant Maternal Medications:  None Significant Maternal Lab Results:  None Other Comments:   None  Review of Systems  Constitutional: Negative.  Negative for chills and fever.  Respiratory: Negative.  Negative for shortness of breath.   Cardiovascular: Negative.  Negative for chest pain.  Gastrointestinal: Negative.  Negative for abdominal pain.  Genitourinary: Negative.   Neurological: Negative.    Maternal Medical History:  Reason for admission: Rupture of membranes.   Contractions: Onset was 1-2 hours ago.    Fetal activity: Perceived fetal activity is normal.   Last perceived fetal movement was within the past hour.    Prenatal complications: no prenatal complications Prenatal Complications - Diabetes: none.      Blood pressure 124/72, pulse 98, temperature 98.6 F (37 C), temperature source Oral, resp. rate 16, height 5\' 3"  (1.6 m), weight 148 lb (67.1 kg), SpO2 99 %, unknown if currently breastfeeding. Maternal Exam:  Uterine Assessment: none  Abdomen: Patient reports no abdominal tenderness. Estimated fetal weight is 3lbs 6oz on 7/30.   Fetal presentation: vertex  Introitus: Normal vulva. Normal vagina.  Ferning test: positive.  Nitrazine test: not done. Amniotic fluid character: clear.  Pelvis: adequate for delivery.   Cervix: Cervix evaluated by sterile speculum exam.     Fetal Exam Fetal Monitor Review: Mode: ultrasound.   Baseline rate: 130.  Variability: moderate (6-25 bpm).   Pattern: accelerations present and no decelerations.    Fetal State Assessment: Category I - tracings are normal.     Physical Exam  Nursing note and vitals reviewed. Constitutional: She is oriented to person, place, and  time. She appears well-developed and well-nourished.  HENT:  Head: Normocephalic and atraumatic.  Eyes: Conjunctivae are normal. No scleral icterus.  Cardiovascular: Normal rate, regular rhythm and normal heart sounds.   Respiratory: Effort normal and breath sounds normal. No respiratory distress.  GI: Soft. She exhibits no distension. There is no  tenderness. There is no guarding.  Genitourinary: No bleeding in the vagina.  Genitourinary Comments: Copious clear fluid pooling in vault  Neurological: She is alert and oriented to person, place, and time.  Skin: Skin is warm and dry.  Psychiatric: She has a normal mood and affect. Her behavior is normal. Judgment and thought content normal.    Prenatal labs: ABO, Rh: --/--/O POS (11/07 96040623) Antibody: NEG (11/07 [id number]) Rubella: 2.74 (08/28 1447) RPR: Non Reactive (11/07 1151)  HBsAg: Negative (08/28 1447)  HIV:    GBS:     Assessment/Plan: IUP at 31.3 weeks. PPROM. Category I fetal tracing. GBS unknown, culture pending   Plan: Admit to High Risk OB Manage expectantly Vertex presentation confirmed by bedside u/s Latency antibiotics   Cleone SlimCaroline Neill SNM 05/14/2017, 8:56 PM   Midwife attestation: I have seen and examined this patient; I agree with above documentation in the student's note.   Patty Espinoza is a 26 y.o. [id number] @31 .3 here for PPROM  PE: Gen: calm comfortable, NAD Resp: normal effort, no distress Abd: gravid  ROS, labs, PMH reviewed  Assessment/Plan: PPROM @31 .3 wks Admit to Antepartum Unit NICU consult Expectant mngt Mngt per MD-Dr. Despina HiddenEure aware  Donette LarryMelanie Cletus Mehlhoff, CNM  05/14/2017, 9:12 PM

## 2017-05-15 ENCOUNTER — Encounter: Payer: Self-pay | Admitting: Advanced Practice Midwife

## 2017-05-15 ENCOUNTER — Encounter: Payer: Medicaid Other | Admitting: Family Medicine

## 2017-05-15 ENCOUNTER — Encounter (HOSPITAL_COMMUNITY): Payer: Self-pay

## 2017-05-15 DIAGNOSIS — O0990 Supervision of high risk pregnancy, unspecified, unspecified trimester: Secondary | ICD-10-CM | POA: Insufficient documentation

## 2017-05-15 LAB — RPR: RPR: NONREACTIVE

## 2017-05-15 LAB — CULTURE, BETA STREP (GROUP B ONLY)

## 2017-05-15 MED ORDER — MAGNESIUM SULFATE BOLUS VIA INFUSION
5.0000 g | Freq: Once | INTRAVENOUS | Status: AC
  Administered 2017-05-15: 5 g via INTRAVENOUS
  Filled 2017-05-15: qty 500

## 2017-05-15 MED ORDER — MAGNESIUM SULFATE 40 G IN LACTATED RINGERS - SIMPLE
2.0000 g/h | INTRAVENOUS | Status: DC
  Administered 2017-05-15: 2 g/h via INTRAVENOUS
  Filled 2017-05-15: qty 500

## 2017-05-15 NOTE — Progress Notes (Signed)
Antepartum Note  05/15/2017 - 9:36 AM  26 y.o. [id number] 58104w4d (19wk u/s) . [facility], h/o PTBs, h/o drug use  Patient Active Problem List   Diagnosis Date Noted  . Premature rupture of membranes 05/14/2017  . Drug use affecting pregnancy, antepartum 08/12/2016  . Preterm premature rupture of membranes (PPROM) with unknown onset of labor 08/11/2016  . Fetal cardiac echogenic focus 06/12/2016  . Supervision of high risk pregnancy, antepartum 06/10/2016  . High risk pregnancy due to history of preterm labor, antepartum 06/10/2016   Ms. Shawn Routemani Brougher is admitted for PPROM at 1900 8/1   Subjective:  Not feeling any UCs now on Mg.   Objective:    Current Vital Signs 24h Vital Sign Ranges  T 98.1 F (36.7 C) Temp  Avg: 98.3 F (36.8 C)  Min: 98 F (36.7 C)  Max: 98.6 F (37 C)  BP (!) 104/53 (RN notified) BP  Min: 99/36  Max: 124/72  HR 79 Pulse  Avg: 81.8  Min: 69  Max: 98  RR 15 Resp  Avg: 15.8  Min: 12  Max: 20  SaO2 100 % Not Delivered SpO2  Avg: 99.4 %  Min: 98 %  Max: 100 %       24 Hour I/O Current Shift I/O  Time Ins Outs 08/01 0701 - 08/02 0700 In: 500 [I.V.:500] Out: 700 [Urine:700] 08/02 0701 - 08/02 1900 In: 762.5 [I.V.:412.5] Out: 500 [Urine:500]   FHR: 135 baseline, +accels, no decels, mod variability Toco: quiet Gen: NAD SVE: deferred  Labs:   Recent Labs Lab 05/14/17 2120  WBC 13.7*  HGB 9.0*  HCT 26.5*  PLT 233   O POS  Pending: GBS, RPR  Medications Current Facility-Administered Medications  Medication Dose Route Frequency Provider Last Rate Last Dose  . acetaminophen (TYLENOL) tablet 650 mg  650 mg Oral Q4H PRN Donette LarryBhambri, Melanie, CNM      . ampicillin (OMNIPEN) 2 g in sodium chloride 0.9 % 50 mL IVPB  2 g Intravenous Q6H Donette LarryBhambri, Melanie, CNM   Stopped at 05/15/17 [id number]   Followed by  . [START ON 05/16/2017] amoxicillin (AMOXIL) capsule 500 mg  500 mg Oral Q8H Bhambri, Melanie, CNM      . azithromycin (ZITHROMAX) 500 mg in  dextrose 5 % 250 mL IVPB  500 mg Intravenous Q24H Donette LarryBhambri, Melanie, CNM   Stopped at 05/15/17 0104   Followed by  . [START ON 05/16/2017] azithromycin (ZITHROMAX) tablet 500 mg  500 mg Oral Daily Donette LarryBhambri, Melanie, CNM      . calcium carbonate (TUMS - dosed in mg elemental calcium) chewable tablet 400 mg of elemental calcium  2 tablet Oral Q4H PRN Donette LarryBhambri, Melanie, CNM      . docusate sodium (COLACE) capsule 100 mg  100 mg Oral Daily Donette LarryBhambri, Melanie, CNM      . lactated ringers infusion   Intravenous Continuous Shirley, [place], DO 125 mL/hr at 05/14/17 2126    . magnesium sulfate 40 grams in LR 500 mL OB infusion  2 g/hr Intravenous Titrated Lazaro ArmsEure, Luther H, MD 25 mL/hr at 05/15/17 0135 2 g/hr at 05/15/17 0135  . prenatal multivitamin tablet 1 tablet  1 tablet Oral Q1200 Donette LarryBhambri, Melanie, CNM      . zolpidem (AMBIEN) tablet 5 mg  5 mg Oral QHS PRN Donette LarryBhambri, Melanie, CNM        Assessment & Plan:  Pt stable *IUP: category I with accels. Request sent for patient to sign BTL papers.  Will need 1hr GTT *Preterm: s/p BMZ on 7/25-26. NICU consulted. Likely will d/c Mg later this morning to get 12 hours of Mg on for fetal neuroprotection -7/30: ceph, AFI 7, efw 37%, 1537gm, AC 37% *PPROM: continue latency abx D#2 *h/o PTB: late PNC to start 17p.  *Late PNC: UDS ordered.  *h/o CT: another TOC ordered. 7/25 negative.  *GBS: pending *Analgesia: no needs  Cornelia Copaharlie Karon Cotterill, Jr. MD Attending Center for [facility](Faculty Practice)

## 2017-05-16 DIAGNOSIS — O0930 Supervision of pregnancy with insufficient antenatal care, unspecified trimester: Secondary | ICD-10-CM

## 2017-05-16 DIAGNOSIS — B951 Streptococcus, group B, as the cause of diseases classified elsewhere: Secondary | ICD-10-CM | POA: Diagnosis not present

## 2017-05-16 DIAGNOSIS — Z3A31 31 weeks gestation of pregnancy: Secondary | ICD-10-CM

## 2017-05-16 DIAGNOSIS — O42913 Preterm premature rupture of membranes, unspecified as to length of time between rupture and onset of labor, third trimester: Principal | ICD-10-CM

## 2017-05-16 DIAGNOSIS — O24419 Gestational diabetes mellitus in pregnancy, unspecified control: Secondary | ICD-10-CM | POA: Diagnosis not present

## 2017-05-16 HISTORY — DX: Gestational diabetes mellitus in pregnancy, unspecified control: O24.419

## 2017-05-16 LAB — GLUCOSE TOLERANCE, 1 HOUR: Glucose, 1 Hour GTT: 189 mg/dL — ABNORMAL HIGH (ref 70–140)

## 2017-05-16 MED ORDER — TETANUS-DIPHTH-ACELL PERTUSSIS 5-2.5-18.5 LF-MCG/0.5 IM SUSP: 0.5000 mL | Freq: Once | INTRAMUSCULAR | Status: DC

## 2017-05-16 MED ORDER — SODIUM CHLORIDE 0.9% FLUSH
3.0000 mL | Freq: Two times a day (BID) | INTRAVENOUS | Status: DC
  Administered 2017-05-16 – 2017-05-19 (×5): 3 mL via INTRAVENOUS

## 2017-05-16 NOTE — Progress Notes (Addendum)
Antepartum Note  05/16/2017 - 8:53 AM  26 y.o. [id number] @ 1963w5d (19wk u/s) HD#3 admitted for PPROM on 8/1 @ 1900 . [facility], h/o drug use  Patient Active Problem List   Diagnosis Date Noted  . Positive GBS test 05/16/2017  . Abnormal glucose affecting pregnancy 05/16/2017  . Preterm premature rupture of membranes in third trimester 05/16/2017  . Late prenatal care 05/16/2017  . Supervision of high risk pregnancy, antepartum 05/15/2017   Subjective:  No s/s of PTL or infection. Still has some LOF clear  Objective:    Current Vital Signs 24h Vital Sign Ranges  T 98.1 F (36.7 C) Temp  Avg: 98.3 F (36.8 C)  Min: 97.7 F (36.5 C)  Max: 98.7 F (37.1 C)  BP (!) 104/50 BP  Min: 98/40  Max: 104/50  HR 83 Pulse  Avg: 84.6  Min: 79  Max: 96  RR 16 Resp  Avg: 15.6  Min: 15  Max: 16  SaO2 100 % Not Delivered SpO2  Avg: 100 %  Min: 100 %  Max: 100 %       24 Hour I/O Current Shift I/O  Time Ins Outs 08/02 0701 - 08/03 0700 In: 3625 [I.V.:3225] Out: 1300 [Urine:1300] No intake/output data recorded.   FHR: 130 baseline, +accels, no decels, mod variability Toco: quiet Gen: NAD CV: normal s1 and s2, no MRGs Pulm: CTAB Abd: gravid, nttp SVE: deferred. Pt has never been checked. Was visually closed in MAU  Labs:   Recent Labs Lab 05/14/17 2120  WBC 13.7*  HGB 9.0*  HCT 26.5*  PLT 233   O POS   1hr GTT: 189 RPR neg GBS pos  Medications Current Facility-Administered Medications  Medication Dose Route Frequency Provider Last Rate Last Dose  . acetaminophen (TYLENOL) tablet 650 mg  650 mg Oral Q4H PRN Donette LarryBhambri, Melanie, CNM      . ampicillin (OMNIPEN) 2 g in sodium chloride 0.9 % 50 mL IVPB  2 g Intravenous Q6H Bhambri, Shawna OrleansMelanie, CNM 150 mL/hr at 05/16/17 0553 2 g at 05/16/17 0553   Followed by  . amoxicillin (AMOXIL) capsule 500 mg  500 mg Oral Q8H Bhambri, Melanie, CNM      . azithromycin (ZITHROMAX) tablet 500 mg  500 mg Oral Daily  Donette LarryBhambri, Melanie, CNM      . calcium carbonate (TUMS - dosed in mg elemental calcium) chewable tablet 400 mg of elemental calcium  2 tablet Oral Q4H PRN Donette LarryBhambri, Melanie, CNM      . docusate sodium (COLACE) capsule 100 mg  100 mg Oral Daily Donette LarryBhambri, Melanie, CNM   100 mg at 05/15/17 1049  . prenatal multivitamin tablet 1 tablet  1 tablet Oral Q1200 Donette LarryBhambri, Melanie, CNM   1 tablet at 05/15/17 1049  . Tdap (BOOSTRIX) injection 0.5 mL  0.5 mL Intramuscular Once [place] BingPickens, Gracin Mcpartland, MD      . zolpidem (AMBIEN) tablet 5 mg  5 mg Oral QHS PRN Donette LarryBhambri, Melanie, CNM        Assessment & Plan:  Pt stable *IUP: category I with accels.  -BTL papers signed 8/2 *elevated 1hr: for 3hr GTT tomorrow.  *Preterm: s/p BMZ on 7/25-26 and s/p Mg on admission. NICU consulted. -7/30: ceph, AFI 7, efw 37%, 1537gm, AC 37% *PPROM: continue latency abx D#3 *h/o PTB: late PNC to start 17p.  *GBS pos: tx when in labor *Late PNC: UDS ordered.  *h/o CT: another TOC ordered. 7/25 negative.  *Analgesia: no  needs *Dispo: try to get to 34wks and delivery.   Patty Espinoza, Jr. MD Attending Center for [facility](Faculty Practice)

## 2017-05-17 LAB — TYPE AND SCREEN
ABO/RH(D): O POS
ABO/RH(D): O POS
ANTIBODY SCREEN: NEGATIVE
Antibody Screen: NEGATIVE

## 2017-05-17 LAB — RAPID URINE DRUG SCREEN, HOSP PERFORMED
Amphetamines: NOT DETECTED
BARBITURATES: NOT DETECTED
Benzodiazepines: NOT DETECTED
Cocaine: NOT DETECTED
OPIATES: NOT DETECTED
TETRAHYDROCANNABINOL: NOT DETECTED

## 2017-05-17 LAB — GLUCOSE, FASTING GESTATIONAL: GLUCOSE, FASTING-GESTATIONAL: 84 mg/dL

## 2017-05-17 LAB — GLUCOSE, CAPILLARY
GLUCOSE-CAPILLARY: 101 mg/dL — AB (ref 65–99)
Glucose-Capillary: 86 mg/dL (ref 65–99)
Glucose-Capillary: 122 mg/dL — ABNORMAL HIGH (ref 65–99)

## 2017-05-17 LAB — GLUCOSE, 1 HOUR GESTATIONAL: GLUCOSE, 1 HOUR-GESTATIONAL: 220 mg/dL — AB (ref 70–189)

## 2017-05-17 LAB — GLUCOSE, 3 HOUR GESTATIONAL: Glucose, GTT - 3 Hour: 137 mg/dL (ref 70–144)

## 2017-05-17 LAB — GLUCOSE, 2 HOUR GESTATIONAL: GLUCOSE, 2 HOUR-GESTATIONAL: 194 mg/dL — AB (ref 70–164)

## 2017-05-17 NOTE — Progress Notes (Signed)
Antepartum Note  05/17/2017 - 8:22 AM  26 y.o. [id number] @ 427w6d (19wk u/s) HD#3 admitted for PPROM on 8/1 @ 1900 . [facility], [facility], h/o drug use  Patient Active Problem List   Diagnosis Date Noted  . Positive GBS test 05/16/2017  . Abnormal glucose affecting pregnancy 05/16/2017  . Preterm premature rupture of membranes in third trimester 05/16/2017  . Late prenatal care 05/16/2017  . Supervision of high risk pregnancy, antepartum 05/15/2017   Subjective:  No s/s of PTL or infection. Still has some LOF clear  Objective:    Current Vital Signs 24h Vital Sign Ranges  T 98.1 F (36.7 C) Temp  Avg: 98.1 F (36.7 C)  Min: 97.8 F (36.6 C)  Max: 98.4 F (36.9 C)  BP (!) 111/53 BP  Min: 110/49  Max: 120/58  HR 78 Pulse  Avg: 83.5  Min: 73  Max: 89  RR 18 Resp  Avg: 16.7  Min: 16  Max: 18  SaO2 99 % Not Delivered SpO2  Avg: 99.7 %  Min: 99 %  Max: 100 %       24 Hour I/O Current Shift I/O  Time Ins Outs 08/03 0701 - 08/04 0700 In: -  Out: 1000 [Urine:1000] No intake/output data recorded.   FHR: 130 baseline, +accels, no decels, mod variability Toco: quiet Gen: NAD CV: normal s1 and s2, no MRGs Pulm: CTAB Abd: gravid, nttp SVE: deferred. Pt has never been checked. Was visually closed in MAU  Labs:   Recent Labs Lab 05/14/17 2120  WBC 13.7*  HGB 9.0*  HCT 26.5*  PLT 233   O POS   1hr GTT: 189 RPR neg GBS pos  Medications Current Facility-Administered Medications  Medication Dose Route Frequency Provider Last Rate Last Dose  . acetaminophen (TYLENOL) tablet 650 mg  650 mg Oral Q4H PRN Donette LarryBhambri, Melanie, CNM      . amoxicillin (AMOXIL) capsule 500 mg  500 mg Oral Q8H Bhambri, Melanie, CNM   500 mg at 05/17/17 0350  . azithromycin (ZITHROMAX) tablet 500 mg  500 mg Oral Daily Donette LarryBhambri, Melanie, CNM   500 mg at 05/16/17 2349  . calcium carbonate (TUMS - dosed in mg elemental calcium) chewable tablet 400 mg of elemental calcium  2 tablet Oral  Q4H PRN Donette LarryBhambri, Melanie, CNM      . docusate sodium (COLACE) capsule 100 mg  100 mg Oral Daily Donette LarryBhambri, Melanie, CNM   100 mg at 05/15/17 1049  . prenatal multivitamin tablet 1 tablet  1 tablet Oral Q1200 Donette LarryBhambri, Melanie, CNM   1 tablet at 05/16/17 1127  . sodium chloride flush (NS) 0.9 % injection 3 mL  3 mL Intravenous Q12H Verble Styron A, MD   3 mL at 05/16/17 2350  . Tdap (BOOSTRIX) injection 0.5 mL  0.5 mL Intramuscular Once [place] BingPickens, Charlie, MD      . zolpidem (AMBIEN) tablet 5 mg  5 mg Oral QHS PRN Donette LarryBhambri, Melanie, CNM        Assessment & Plan:  Pt stable *IUP: category I with accels.  -BTL papers signed 8/2 *elevated 1hr: for 3hr GTT today *Preterm: s/p BMZ on 7/25-26 and s/p Mg on admission. NICU consulted. -7/30: ceph, AFI 7, efw 37%, 1537gm, AC 37% *PPROM: continue latency abx D#4 *h/o PTB: late PNC to start 17p.  *GBS pos: tx when in labor *Late PNC: UDS ordered.  *h/o CT: another TOC ordered. 7/25 negative.  *Analgesia: no needs *Dispo: try to  get to 34wks and delivery.   Jaynie CollinsUgonna Shameria Trimarco, MD Attending Center for [facility](Faculty Practice)

## 2017-05-18 LAB — GLUCOSE, CAPILLARY
GLUCOSE-CAPILLARY: 104 mg/dL — AB (ref 65–99)
GLUCOSE-CAPILLARY: 108 mg/dL — AB (ref 65–99)
GLUCOSE-CAPILLARY: 80 mg/dL (ref 65–99)
Glucose-Capillary: 88 mg/dL (ref 65–99)

## 2017-05-18 NOTE — Progress Notes (Signed)
Patient ID: Patty Espinoza, female   DOB: [birth year], 26 y.o.   MRN: [id number] [facility] ANTEPARTUM COMPREHENSIVE PROGRESS NOTE  Patty Espinoza is a 26 y.o. [id number] at 6531w0d  who is admitted for PROM.   Fetal presentation is cephalic. Length of Stay:  4  Days  Subjective: Pt without complaints this morning. Reports + fetal movement, still some LOF at times. Denies pain, cramps or ctx.    Vitals:  Blood pressure (!) 97/52, pulse 83, temperature 98.4 F (36.9 C), temperature source Oral, resp. rate 16, height 5\' 3"  (1.6 m), weight 67.1 kg (148 lb), SpO2 100 %, unknown if currently breastfeeding. Physical Examination: Lungs clear  Heart RRR Abd soft + BS gravid non tender Ext non tender  Fetal Monitoring:  120-150's, good variablity, + acels, reactive x 2  Labs:  Results for orders placed or performed during the hospital encounter of 05/14/17 (from the past 24 hour(s))  Glucose, 3 hour gestational   Collection Time: 05/17/17  8:45 AM  Result Value Ref Range   Glucose, GTT - 3 Hour 137 70 - 144 mg/dL  Glucose, capillary   Collection Time: 05/17/17  1:26 PM  Result Value Ref Range   Glucose-Capillary 86 65 - 99 mg/dL  Urine rapid drug screen (hosp performed)   Collection Time: 05/17/17  1:30 PM  Result Value Ref Range   Opiates NONE DETECTED NONE DETECTED   Cocaine NONE DETECTED NONE DETECTED   Benzodiazepines NONE DETECTED NONE DETECTED   Amphetamines NONE DETECTED NONE DETECTED   Tetrahydrocannabinol NONE DETECTED NONE DETECTED   Barbiturates NONE DETECTED NONE DETECTED  Glucose, capillary   Collection Time: 05/17/17  5:52 PM  Result Value Ref Range   Glucose-Capillary 122 (H) 65 - 99 mg/dL  Glucose, capillary   Collection Time: 05/17/17 10:30 PM  Result Value Ref Range   Glucose-Capillary 101 (H) 65 - 99 mg/dL  Glucose, capillary   Collection Time: 05/18/17  6:07 AM  Result Value Ref Range   Glucose-Capillary 88 65 - 99 mg/dL    Imaging Studies:    none    Medications:  Scheduled . amoxicillin  500 mg Oral Q8H  . azithromycin  500 mg Oral Daily  . docusate sodium  100 mg Oral Daily  . prenatal multivitamin  1 tablet Oral Q1200  . sodium chloride flush  3 mL Intravenous Q12H  . Tdap  0.5 mL Intramuscular Once   I have reviewed the patient's current medications.  ASSESSMENT: IUP 32 weeks PROM Class A1 GDM + GBS Unwanted fertility, Papers signed H/O CT, negative TOC  PLAN: Stable. BS in goal with diet so far. Continue with latency antibiotics. S/p BMZ.  No s/Sx of infection or labor. Delivery at 34 weeks or for maternal or fetal indications.  Continue routine antenatal care.   Hermina StaggersMichael L Summer Mccolgan 05/18/2017,7:53 AM

## 2017-05-19 LAB — GLUCOSE, CAPILLARY
Glucose-Capillary: 89 mg/dL (ref 65–99)
Glucose-Capillary: 138 mg/dL — ABNORMAL HIGH (ref 65–99)
Glucose-Capillary: 123 mg/dL — ABNORMAL HIGH (ref 65–99)

## 2017-05-19 NOTE — Progress Notes (Signed)
Patient ID: Patty Espinoza, female   DOB: Jul 22, 1991, 26 y.o.   MRN: [id number] [facility] ANTEPARTUM COMPREHENSIVE PROGRESS NOTE  Patty Espinoza is a 26 y.o. [id number] at 5991w1d  who is admitted for PROM.   Fetal presentation is cephalic. Length of Stay:  5  Days  Subjective: Pt without complaints today. Has question about diet and GDM. Reports + FM. Denies ut ctx, cramps or pressure.   Vitals:  Blood pressure (!) 115/56, pulse 81, temperature 97.9 F (36.6 C), temperature source Oral, resp. rate 16, height 5\' 3"  (1.6 m), weight 67.1 kg (148 lb), SpO2 100 %, unknown if currently breastfeeding.   Physical Examination: Lungs clear Heart RRR Abd soft + BS gravid non tender Ext non tender  Fetal Monitoring:  130-150's, accels, good variablity, reactive  Labs:  Results for orders placed or performed during the hospital encounter of 05/14/17 (from the past 24 hour(s))  Glucose, capillary   Collection Time: 05/18/17 12:48 PM  Result Value Ref Range   Glucose-Capillary 80 65 - 99 mg/dL  Glucose, capillary   Collection Time: 05/18/17  5:10 PM  Result Value Ref Range   Glucose-Capillary 108 (H) 65 - 99 mg/dL  Glucose, capillary   Collection Time: 05/18/17 11:36 PM  Result Value Ref Range   Glucose-Capillary 104 (H) 65 - 99 mg/dL  Glucose, capillary   Collection Time: 05/19/17  6:10 AM  Result Value Ref Range   Glucose-Capillary 89 65 - 99 mg/dL    Imaging Studies:    none   Medications:  Scheduled . amoxicillin  500 mg Oral Q8H  . azithromycin  500 mg Oral Daily  . docusate sodium  100 mg Oral Daily  . prenatal multivitamin  1 tablet Oral Q1200  . sodium chloride flush  3 mL Intravenous Q12H  . Tdap  0.5 mL Intramuscular Once   I have reviewed the patient's current medications.  ASSESSMENT: IUP 32 1/7 weeks PROM Class A 1 DM + GBS Unwanted fertility, papers signed H/O CT, negative TOC  PLAN: Stable. BS in goal range. Dietician  consult today. S/P BMZ. Latency  antibiotics. No S/Sx of infection. Delivery at 34 weeks or for maternal/fetal indications. Continue routine antenatal care.   Hermina StaggersMichael L Melany Wiesman 05/19/2017,10:38 AM

## 2017-05-19 NOTE — Progress Notes (Signed)
Initial Nutrition Assessment  DOCUMENTATION CODES:   Not applicable  INTERVENTION:  Gestational Diabetic Diet Double protein portions Pt may order her own diabetic snacks from menu provided  NUTRITION DIAGNOSIS:  Increased nutrient needs related to  (pregnancy and feteal growth requirements) as evidenced by  (32 weeks IUP).  GOAL:  Patient will meet greater than or equal to 90% of their needs  MONITOR:  Labs  REASON FOR ASSESSMENT:  Gestational Diabetes, Antenatal   ASSESSMENT:   32 1/7 weeks, PROM. pre-preg weight 137 lbs, 11 lbs weight gain. Elevated 3 hr GTT, placed on gestational DM diet. reports being very hungry Will increase protein content of diet to provide sateity  Diet Order:  Diet gestational carb mod Room service appropriate? Yes; Fluid consistency: Thin  Skin:  Reviewed, no issues Height:   Ht Readings from Last 1 Encounters:  05/14/17 5\' 3"  (1.6 m)    Weight:   Wt Readings from Last 1 Encounters:  05/14/17 148 lb (67.1 kg)    Ideal Body Weight:   115 lbs  BMI:  Body mass index is 26.22 kg/m.  Estimated Nutritional Needs:   Kcal:  1700-1900  Protein:  75-85 g  Fluid:  2L  EDUCATION NEEDS:   Education needs addressed. Reviewed basics of GDM diet with pt. She will be in house until delivery so comprehension of counting CHO's not needed. [facility] will manage CHO limits for pt. Understands to avoid beverages with calories, foods with high sugar content  Elisabeth CaraKatherine Stephen Turnbaugh M.Odis LusterEd. R.D. LDN Neonatal Nutrition Support Specialist/RD III Pager [phone]      Phone [phone]

## 2017-05-20 ENCOUNTER — Encounter (HOSPITAL_COMMUNITY): Payer: Self-pay | Admitting: *Deleted

## 2017-05-20 ENCOUNTER — Telehealth: Payer: Self-pay | Admitting: General Practice

## 2017-05-20 DIAGNOSIS — O42113 Preterm premature rupture of membranes, onset of labor more than 24 hours following rupture, third trimester: Secondary | ICD-10-CM

## 2017-05-20 DIAGNOSIS — O9902 Anemia complicating childbirth: Secondary | ICD-10-CM | POA: Diagnosis present

## 2017-05-20 DIAGNOSIS — Z3A32 32 weeks gestation of pregnancy: Secondary | ICD-10-CM

## 2017-05-20 DIAGNOSIS — O99824 Streptococcus B carrier state complicating childbirth: Secondary | ICD-10-CM

## 2017-05-20 DIAGNOSIS — O321XX0 Maternal care for breech presentation, not applicable or unspecified: Secondary | ICD-10-CM

## 2017-05-20 LAB — DIFFERENTIAL
BASOS ABS: 0 10*3/uL (ref 0.0–0.1)
BASOS PCT: 0 %
EOS ABS: 0.1 10*3/uL (ref 0.0–0.7)
Eosinophils Relative: 1 %
Lymphocytes Relative: 25 %
Lymphs Abs: 4.4 10*3/uL — ABNORMAL HIGH (ref 0.7–4.0)
Monocytes Absolute: 0.5 10*3/uL (ref 0.1–1.0)
Monocytes Relative: 3 %
NEUTROS ABS: 12.9 10*3/uL — AB (ref 1.7–7.7)
NEUTROS PCT: 72 %

## 2017-05-20 LAB — CBC
HCT: 28.5 % — ABNORMAL LOW (ref 36.0–46.0)
Hemoglobin: 9.8 g/dL — ABNORMAL LOW (ref 12.0–15.0)
MCH: 27.5 pg (ref 26.0–34.0)
MCHC: 34.4 g/dL (ref 30.0–36.0)
MCV: 79.8 fL (ref 78.0–100.0)
PLATELETS: 217 10*3/uL (ref 150–400)
RBC: 3.57 MIL/uL — ABNORMAL LOW (ref 3.87–5.11)
RDW: 14.5 % (ref 11.5–15.5)
WBC: 18 10*3/uL — ABNORMAL HIGH (ref 4.0–10.5)

## 2017-05-20 LAB — RAPID HIV SCREEN (HIV 1/2 AB+AG)
HIV 1/2 Antibodies: NONREACTIVE
HIV-1 P24 ANTIGEN - HIV24: NONREACTIVE

## 2017-05-20 LAB — HEPATITIS B SURFACE ANTIGEN: HEP B S AG: NEGATIVE

## 2017-05-20 MED ORDER — TERBUTALINE SULFATE 1 MG/ML IJ SOLN
INTRAMUSCULAR | Status: AC
  Filled 2017-05-20: qty 1

## 2017-05-20 MED ORDER — ACETAMINOPHEN 325 MG PO TABS
650.0000 mg | ORAL_TABLET | ORAL | Status: DC | PRN
  Administered 2017-05-20: 650 mg via ORAL
  Filled 2017-05-20: qty 2

## 2017-05-20 MED ORDER — DIPHENHYDRAMINE HCL 25 MG PO CAPS: 25.0000 mg | ORAL_CAPSULE | Freq: Four times a day (QID) | ORAL | Status: DC | PRN

## 2017-05-20 MED ORDER — TERBUTALINE SULFATE 1 MG/ML IJ SOLN: 0.2500 mg | Freq: Once | INTRAMUSCULAR | Status: DC | PRN

## 2017-05-20 MED ORDER — FENTANYL CITRATE (PF) 100 MCG/2ML IJ SOLN
INTRAMUSCULAR | Status: AC
  Administered 2017-05-20: 100 ug
  Filled 2017-05-20: qty 2

## 2017-05-20 MED ORDER — OXYCODONE-ACETAMINOPHEN 5-325 MG PO TABS
1.0000 | ORAL_TABLET | ORAL | Status: DC | PRN
  Administered 2017-05-20 – 2017-05-21 (×2): 1 via ORAL
  Filled 2017-05-20 (×2): qty 1

## 2017-05-20 MED ORDER — FLEET ENEMA 7-19 GM/118ML RE ENEM: 1.0000 | ENEMA | RECTAL | Status: DC | PRN

## 2017-05-20 MED ORDER — PRENATAL MULTIVITAMIN CH
1.0000 | ORAL_TABLET | Freq: Every day | ORAL | Status: DC
  Administered 2017-05-20 – 2017-05-21 (×2): 1 via ORAL
  Filled 2017-05-20 (×2): qty 1

## 2017-05-20 MED ORDER — OXYCODONE-ACETAMINOPHEN 5-325 MG PO TABS
2.0000 | ORAL_TABLET | ORAL | Status: DC | PRN
  Administered 2017-05-20: 2 via ORAL
  Filled 2017-05-20: qty 2

## 2017-05-20 MED ORDER — WITCH HAZEL-GLYCERIN EX PADS: 1.0000 "application " | MEDICATED_PAD | CUTANEOUS | Status: DC | PRN

## 2017-05-20 MED ORDER — FENTANYL CITRATE (PF) 100 MCG/2ML IJ SOLN: 100.0000 ug | INTRAMUSCULAR | Status: DC | PRN

## 2017-05-20 MED ORDER — LIDOCAINE HCL (PF) 1 % IJ SOLN
30.0000 mL | INTRAMUSCULAR | Status: DC | PRN
  Filled 2017-05-20: qty 30

## 2017-05-20 MED ORDER — IBUPROFEN 600 MG PO TABS
600.0000 mg | ORAL_TABLET | Freq: Four times a day (QID) | ORAL | Status: DC
  Administered 2017-05-20 – 2017-05-21 (×6): 600 mg via ORAL
  Filled 2017-05-20 (×6): qty 1

## 2017-05-20 MED ORDER — TETANUS-DIPHTH-ACELL PERTUSSIS 5-2.5-18.5 LF-MCG/0.5 IM SUSP: 0.5000 mL | Freq: Once | INTRAMUSCULAR | Status: DC

## 2017-05-20 MED ORDER — LACTATED RINGERS IV SOLN: 500.0000 mL | INTRAVENOUS | Status: DC | PRN

## 2017-05-20 MED ORDER — OXYTOCIN 40 UNITS IN LACTATED RINGERS INFUSION - SIMPLE MED
2.5000 [IU]/h | INTRAVENOUS | Status: DC
  Filled 2017-05-20: qty 1000

## 2017-05-20 MED ORDER — SIMETHICONE 80 MG PO CHEW: 80.0000 mg | CHEWABLE_TABLET | ORAL | Status: DC | PRN

## 2017-05-20 MED ORDER — ZOLPIDEM TARTRATE 5 MG PO TABS: 5.0000 mg | ORAL_TABLET | Freq: Every evening | ORAL | Status: DC | PRN

## 2017-05-20 MED ORDER — PENICILLIN G POT IN DEXTROSE 60000 UNIT/ML IV SOLN
3.0000 10*6.[IU] | INTRAVENOUS | Status: DC
  Filled 2017-05-20 (×3): qty 50

## 2017-05-20 MED ORDER — OXYTOCIN 40 UNITS IN LACTATED RINGERS INFUSION - SIMPLE MED: 1.0000 m[IU]/min | INTRAVENOUS | Status: DC

## 2017-05-20 MED ORDER — DIBUCAINE 1 % RE OINT: 1.0000 "application " | TOPICAL_OINTMENT | RECTAL | Status: DC | PRN

## 2017-05-20 MED ORDER — OXYTOCIN BOLUS FROM INFUSION: 500.0000 mL | Freq: Once | INTRAVENOUS | Status: DC

## 2017-05-20 MED ORDER — OXYCODONE-ACETAMINOPHEN 5-325 MG PO TABS
1.0000 | ORAL_TABLET | ORAL | Status: DC | PRN
Start: 2017-05-20 — End: 2017-05-20

## 2017-05-20 MED ORDER — BENZOCAINE-MENTHOL 20-0.5 % EX AERO: 1.0000 "application " | INHALATION_SPRAY | CUTANEOUS | Status: DC | PRN

## 2017-05-20 MED ORDER — ONDANSETRON HCL 4 MG/2ML IJ SOLN: 4.0000 mg | INTRAMUSCULAR | Status: DC | PRN

## 2017-05-20 MED ORDER — OXYCODONE-ACETAMINOPHEN 5-325 MG PO TABS: 2.0000 | ORAL_TABLET | ORAL | Status: DC | PRN

## 2017-05-20 MED ORDER — ONDANSETRON HCL 4 MG PO TABS: 4.0000 mg | ORAL_TABLET | ORAL | Status: DC | PRN

## 2017-05-20 MED ORDER — SOD CITRATE-CITRIC ACID 500-334 MG/5ML PO SOLN: 30.0000 mL | ORAL | Status: DC | PRN

## 2017-05-20 MED ORDER — PENICILLIN G POTASSIUM 5000000 UNITS IJ SOLR
5.0000 10*6.[IU] | Freq: Once | INTRAMUSCULAR | Status: DC
  Filled 2017-05-20: qty 5

## 2017-05-20 MED ORDER — HYDROXYZINE HCL 50 MG PO TABS
50.0000 mg | ORAL_TABLET | Freq: Four times a day (QID) | ORAL | Status: DC | PRN
  Filled 2017-05-20: qty 1

## 2017-05-20 MED ORDER — COCONUT OIL OIL: 1.0000 "application " | TOPICAL_OIL | Status: DC | PRN

## 2017-05-20 MED ORDER — SENNOSIDES-DOCUSATE SODIUM 8.6-50 MG PO TABS
2.0000 | ORAL_TABLET | ORAL | Status: DC
  Administered 2017-05-20: 2 via ORAL
  Filled 2017-05-20: qty 2

## 2017-05-20 MED ORDER — OXYTOCIN BOLUS FROM INFUSION
500.0000 mL | Freq: Once | INTRAVENOUS | Status: AC
  Administered 2017-05-20: 500 mL via INTRAVENOUS

## 2017-05-20 MED ORDER — LACTATED RINGERS IV SOLN: INTRAVENOUS | Status: DC

## 2017-05-20 MED ORDER — FENTANYL CITRATE (PF) 100 MCG/2ML IJ SOLN: 100.0000 ug | Freq: Once | INTRAMUSCULAR | Status: AC

## 2017-05-20 MED ORDER — ONDANSETRON HCL 4 MG/2ML IJ SOLN: 4.0000 mg | Freq: Four times a day (QID) | INTRAMUSCULAR | Status: DC | PRN

## 2017-05-20 MED ORDER — ACETAMINOPHEN 325 MG PO TABS: 650.0000 mg | ORAL_TABLET | ORAL | Status: DC | PRN

## 2017-05-20 MED ORDER — OXYTOCIN 40 UNITS IN LACTATED RINGERS INFUSION - SIMPLE MED: 2.5000 [IU]/h | INTRAVENOUS | Status: DC

## 2017-05-20 NOTE — Progress Notes (Signed)
Initial visit to introduce spiritual care services and offer support to patient whose infant son is in NICU.  Pt was on the phone during my visit.  Will continue to follow.    Please page as further needs arise.  Maryanna ShapeAmanda M. Carley Hammedavee Lomax, M.Div. [facility] Chaplain Pager [phone] Office [phone]

## 2017-05-20 NOTE — Lactation Note (Signed)
This note was copied from a baby's chart. Lactation Consultation Note  Patient Name: Patty Espinoza ZOXWR'UToday's Date: 05/20/2017 Reason for consult: Initial assessment;NICU baby  NICU baby 8013 hours old. Mom reports that she is already collecting colostrum and taking to NICU. Mom states that she is looking at photos of baby while pumping and thinks this is helping. Enc mom to keep pumping every 2-3 hours for a total of 8-12 times/24 hours followed by hand expression. Mom states that she had issues with supply, especially with last child who was also premature and in NICU. Discussed early delivery and stress of NICU and possible impact on supply. Enc mom to pump routinely and hand express. [facility] and it was faxed to [place] office--mom states she is in process of moving to [place]. Enc mom to call for assistance as needed.   Maternal Data Has patient been taught Hand Expression?: Yes (Per mom.) Does the patient have breastfeeding experience prior to this delivery?: Yes  Feeding Feeding Type: Breast Milk Length of feed: 5 min  LATCH Score                   Interventions    Lactation Tools Discussed/Used Tools: Pump Breast pump type: Double-Electric Breast Pump WIC Program: Yes Pump Review: Setup, frequency, and cleaning;Milk Storage Initiated by:: bedside RN. Date initiated:: 05/20/17   Consult Status Consult Status: Follow-up Date: 05/21/17 Follow-up type: In-patient    Sherlyn HayJennifer D Saiquan Hands 05/20/2017, 2:09 PM

## 2017-05-20 NOTE — Telephone Encounter (Signed)
Left message on VM in regards to New OB appointment on 06/03/17 at 12:40pm.  Asked patient to give our office a call if she us unable to keep this appointment.

## 2017-05-20 NOTE — Progress Notes (Signed)
DOD SVD Subjective: Pt without complaints this morning. Ambulating and voiding without problems. Breast pumping. Tolerating diet. Pain controlled  Objective: Blood pressure (!) 115/51, pulse 71, temperature 98.4 F (36.9 C), temperature source Oral, resp. rate 16, height 5\' 3"  (1.6 m), weight 148 lb (67.1 kg), SpO2 100 %, unknown if currently breastfeeding.  Physical Exam:  General: no distress Lochia: appropriate Uterine Fundus: firm Incision: NA DVT Evaluation: No evidence of DVT seen on physical exam.  No results for input(s): HGB, HCT in the last 72 hours.  Assessment/Plan: DOD SVD breech delivery  Stable. Has changed her mind about BTL. Continue with progressive care  LOS: 6 days   Hermina StaggersMichael L Ervin 05/20/2017, 9:23 AM

## 2017-05-21 DIAGNOSIS — O42113 Preterm premature rupture of membranes, onset of labor more than 24 hours following rupture, third trimester: Secondary | ICD-10-CM

## 2017-05-21 DIAGNOSIS — O321XX0 Maternal care for breech presentation, not applicable or unspecified: Secondary | ICD-10-CM

## 2017-05-21 DIAGNOSIS — O99824 Streptococcus B carrier state complicating childbirth: Secondary | ICD-10-CM

## 2017-05-21 DIAGNOSIS — Z3A32 32 weeks gestation of pregnancy: Secondary | ICD-10-CM

## 2017-05-21 LAB — RUBELLA SCREEN: Rubella: 2.24 index (ref >0.99)

## 2017-05-21 LAB — RPR: RPR Ser Ql: NONREACTIVE

## 2017-05-21 MED ORDER — IBUPROFEN 600 MG PO TABS: 600.0000 mg | ORAL_TABLET | Freq: Four times a day (QID) | ORAL | 0 refills | Status: DC

## 2017-05-21 NOTE — Discharge Planning (Signed)
Pt. Had communicated she wanted to d/c at 1300. At 1300 pt said her ride would not arrive until after 1500. No needs at this time.

## 2017-05-21 NOTE — Discharge Summary (Signed)
Obstetric Discharge Summary Reason for Admission: rupture of membranes Prenatal Procedures: ultrasound Intrapartum Procedures: spontaneous vaginal delivery, breech Postpartum Procedures: none Complications-Operative and Postpartum: none Hemoglobin  Date Value Ref Range Status  05/20/2017 9.8 (L) 12.0 - 15.0 g/dL Final  40/98/119108/28/2017 47.810.9 (L) 11.1 - 15.9 g/dL Final   HCT  Date Value Ref Range Status  05/20/2017 28.5 (L) 36.0 - 46.0 % Final   Hematocrit  Date Value Ref Range Status  06/10/2016 33.0 (L) 34.0 - 46.6 % Final   Hospital course; Pt was admitted for PROM, received BMZ and antibiotics. Onset of labor ensued. Had SVD of breech infant. See delivery note for additional information. Postpartum course was unremarkable. Progressed to ambulating and voiding without problems.+ flatus, tolerating diet, breast pumping and good oral pain control. Amendable for discharge home on PPD # 1.   Physical Exam:  General: no distress Lochia: appropriate Uterine Fundus: firm Incision: NA DVT Evaluation: No evidence of DVT seen on physical exam.  Discharge Diagnoses: SVD, breech  Discharge Information: Date: 05/21/2017 Activity: pelvic rest Diet: routine Medications: PNV and Ibuprofen Condition: stable Instructions: refer to practice specific booklet Discharge to: home Follow-up Information    [facility] Schedule an appointment as soon as possible for a visit in 4 week(s).   Contact information: [address] [place] [phone]          Newborn Data: Live born female  Birth Weight: 3 lb 5.3 oz (1510 g) APGAR: 5, 8  Stable in NICU  Hermina StaggersMichael L Ervin 05/21/2017, 9:16 AM

## 2017-06-03 ENCOUNTER — Encounter: Payer: Medicaid Other | Admitting: Obstetrics and Gynecology

## 2017-06-12 ENCOUNTER — Ambulatory Visit: Payer: Self-pay

## 2017-06-12 NOTE — Lactation Note (Signed)
This note was copied from a baby's chart. Lactation Consultation Note  Patient Name: Patty Espinoza [id number] Date: 06/12/2017   RN called LC because mom forgot pump tubing and will be rooming-in with baby tonight.  LC told RN to give mom new kit since we do not have sterile replacement supplies at this time.   Patty Espinoza 06/12/2017, 7:33 PM

## 2017-07-14 ENCOUNTER — Ambulatory Visit (INDEPENDENT_AMBULATORY_CARE_PROVIDER_SITE_OTHER): Payer: Medicaid Other | Admitting: Obstetrics & Gynecology

## 2017-07-14 ENCOUNTER — Other Ambulatory Visit (HOSPITAL_COMMUNITY)
Admission: RE | Admit: 2017-07-14 | Discharge: 2017-07-14 | Disposition: A | Discharge status: Home / Self Care | Payer: Medicaid Other | Source: Ambulatory Visit | Attending: Obstetrics & Gynecology | Admitting: Obstetrics & Gynecology

## 2017-07-14 ENCOUNTER — Encounter: Payer: Self-pay | Admitting: Obstetrics & Gynecology

## 2017-07-14 DIAGNOSIS — R87810 Cervical high risk human papillomavirus (HPV) DNA test positive: Principal | ICD-10-CM

## 2017-07-14 DIAGNOSIS — Z3042 Encounter for surveillance of injectable contraceptive: Secondary | ICD-10-CM | POA: Diagnosis not present

## 2017-07-14 DIAGNOSIS — R87610 Atypical squamous cells of undetermined significance on cytologic smear of cervix (ASC-US): Secondary | ICD-10-CM | POA: Insufficient documentation

## 2017-07-14 DIAGNOSIS — Z3202 Encounter for pregnancy test, result negative: Secondary | ICD-10-CM

## 2017-07-14 DIAGNOSIS — O24419 Gestational diabetes mellitus in pregnancy, unspecified control: Secondary | ICD-10-CM

## 2017-07-14 DIAGNOSIS — Z30013 Encounter for initial prescription of injectable contraceptive: Secondary | ICD-10-CM

## 2017-07-14 LAB — POCT PREGNANCY, URINE: PREG TEST UR: NEGATIVE

## 2017-07-14 MED ORDER — MEDROXYPROGESTERONE ACETATE 150 MG/ML IM SUSP: 150.0000 mg | INTRAMUSCULAR | 0 refills | Status: DC

## 2017-07-14 MED ORDER — MEDROXYPROGESTERONE ACETATE 150 MG/ML IM SUSP
150.0000 mg | Freq: Once | INTRAMUSCULAR | Status: AC
  Administered 2017-07-14: 150 mg via INTRAMUSCULAR

## 2017-07-14 NOTE — Progress Notes (Signed)
Completed [place] , phq9 and gad 7 screens.

## 2017-07-14 NOTE — Patient Instructions (Signed)

## 2017-07-14 NOTE — Progress Notes (Signed)
Subjective:     Patty Espinoza is a 26 y.o. female who presents for a postpartum visit. She is 7 weeks postpartum following a spontaneous vaginal delivery. I have fully reviewed the prenatal and intrapartum course. The delivery was at 31 gestational weeks. Outcome: spontaneous vaginal delivery. Anesthesia: epidural. Postpartum course has been good. Baby's course has been notable for NICU stay. Baby is feeding by bottle -  . Bleeding no bleeding. Bowel function is normal. Bladder function is normal. Patient is sexually active. Contraception method is Depo-Provera injections. Postpartum depression screening: negative.  The following portions of the patient's history were reviewed and updated as appropriate: allergies, current medications, past family history, past medical history, past social history, past surgical history and problem list.  Review of Systems Pertinent items are noted in HPI.   Objective:    There were no vitals taken for this visit.              Abdomen: soft, non-tender; bowel sounds normal; no masses,  no organomegaly   Vulva:  normal  Vagina: normal vagina  Cervix:  no lesions  Corpus: normal  Adnexa:  no mass, fullness, tenderness  Rectal Exam: Not performed.        Assessment:     normal postpartum exam. Pap smear done at today's visit.   Plan:    1. Contraception: Depo-Provera injections 2. H/o ASC-H 04/2016, pap repeated today 3. Follow up in: 2 weeks for 2 hr GTT  Adam Phenix, MD 07/14/2017

## 2017-07-16 LAB — CYTOLOGY - PAP
Diagnosis: UNDETERMINED — AB
HPV: DETECTED — AB

## 2017-07-18 ENCOUNTER — Other Ambulatory Visit: Payer: Medicaid Other

## 2017-07-22 ENCOUNTER — Encounter: Payer: Self-pay | Admitting: General Practice

## 2017-08-09 ENCOUNTER — Emergency Department (HOSPITAL_COMMUNITY)
Admission: EM | Admit: 2017-08-09 | Discharge: 2017-08-09 | Disposition: A | Discharge status: Home / Self Care | Payer: Medicaid Other | Attending: Emergency Medicine | Admitting: Emergency Medicine

## 2017-08-09 ENCOUNTER — Encounter (HOSPITAL_COMMUNITY): Payer: Self-pay | Admitting: Emergency Medicine

## 2017-08-09 DIAGNOSIS — Z91040 Latex allergy status: Secondary | ICD-10-CM | POA: Diagnosis not present

## 2017-08-09 DIAGNOSIS — J029 Acute pharyngitis, unspecified: Secondary | ICD-10-CM | POA: Diagnosis present

## 2017-08-09 DIAGNOSIS — Z87891 Personal history of nicotine dependence: Secondary | ICD-10-CM | POA: Diagnosis not present

## 2017-08-09 DIAGNOSIS — J069 Acute upper respiratory infection, unspecified: Secondary | ICD-10-CM | POA: Diagnosis not present

## 2017-08-09 NOTE — Discharge Instructions (Signed)
Please follow with your primary care doctor in the next 2 days for a check-up. They must obtain records for further management.  ° °Do not hesitate to return to the Emergency Department for any new, worsening or concerning symptoms.  ° °

## 2017-08-09 NOTE — ED Notes (Signed)
Pt states 2 days ago, he started experiencing cold symptoms of head congestion, throat soreness and wanted to be checked for pharyngitis.

## 2017-08-09 NOTE — ED Triage Notes (Signed)
Per pt, states her family has been sick for over a week-nasal congestion, sinus drainage

## 2017-08-09 NOTE — ED Provider Notes (Signed)
[facility]-EMERGENCY DEPT Provider Note   CSN: [id number] Arrival date & time: 08/09/17  1854     History   Chief Complaint Chief Complaint  Patient presents with  . URI    HPI   Blood pressure (!) 124/94, pulse (!) 102, temperature 98.4 F (36.9 C), temperature source Oral, resp. rate 18, SpO2 100 %, not currently breastfeeding.  Patty Espinoza is a 26 y.o. female complaining of 2 days of rhinorrhea, sore throat, generalized fatigue and dry cough.  She denies headache, cervicalgia, fever, nausea, vomiting, chest pain, shortness of breath, focal abdominal pain, reduced p.o. intake, change in bowel or bladder habits.  Positive sick contacts in that both of her children are also ill.   Past Medical History:  Diagnosis Date  . Anemia   . GERD (gastroesophageal reflux disease)   . Preterm labor   . Sickle cell trait [facility](HCC)     Patient Active Problem List   Diagnosis Date Noted  . GDM (gestational diabetes mellitus) 05/16/2017    Past Surgical History:  Procedure Laterality Date  . NO PAST SURGERIES      OB History    Gravida Para Term Preterm AB Living   7 5 0 5 2 4    SAB TAB Ectopic Multiple Live Births   1 1 0 0 4       Home Medications    Prior to Admission medications   Medication Sig Start Date End Date Taking? Authorizing Provider  medroxyPROGESTERone (DEPO-PROVERA) 150 MG/ML injection Inject 1 mL (150 mg total) into the muscle every 3 (three) months. 07/14/17   Adam PhenixArnold, James G, MD    Family History Family History  Problem Relation Age of Onset  . Hypertension Mother   . Diabetes Father   . Hypertension Father   . Diabetes Maternal Grandmother   . Hypertension Maternal Grandmother   . Alcohol abuse Maternal Grandfather   . Arthritis Maternal Grandfather   . COPD Maternal Grandfather   . Diabetes Maternal Grandfather   . Hypertension Maternal Grandfather   . Hyperlipidemia Maternal Grandfather   . Vision loss Maternal  Grandfather     Social History Social History  Substance Use Topics  . Smoking status: Former Smoker    Packs/day: 0.50    Years: 6.00    Types: Cigarettes    Quit date: 01/27/2016  . Smokeless tobacco: Never Used  . Alcohol use Yes     Comment: +THC on UDS admission, occasional     Allergies   Latex   Review of Systems Review of Systems  A complete review of systems was obtained and all systems are negative except as noted in the HPI and PMH.   Physical Exam Updated Vital Signs BP (!) 124/94 (BP Location: Right Arm)   Pulse (!) 102   Temp 98.4 F (36.9 C) (Oral)   Resp 18   SpO2 100%   Physical Exam  Constitutional: She appears well-developed and well-nourished.  HENT:  Head: Normocephalic.  Right Ear: External ear normal.  Left Ear: External ear normal.  Mouth/Throat: Oropharynx is clear and moist. No oropharyngeal exudate.  No drooling or stridor. Posterior pharynx mildly erythematous no significant tonsillar hypertrophy. No exudate. Soft palate rises symmetrically. No TTP or induration under tongue.   No tenderness to palpation of frontal or bilateral maxillary sinuses.  Mild mucosal edema in the nares with scant rhinorrhea.  Bilateral tympanic membranes with normal architecture and good light reflex.    Eyes: Pupils are equal,  round, and reactive to light. Conjunctivae and EOM are normal.  Neck: Normal range of motion. Neck supple.  Cardiovascular: Normal rate and regular rhythm.   Pulmonary/Chest: Effort normal and breath sounds normal. No stridor. No respiratory distress. She has no wheezes. She has no rales. She exhibits no tenderness.  Abdominal: Soft. There is no tenderness. There is no rebound and no guarding.  Nursing note and vitals reviewed.    ED Treatments / Results  Labs (all labs ordered are listed, but only abnormal results are displayed) Labs Reviewed - No data to display  EKG  EKG Interpretation None       Radiology No  results found.  Procedures Procedures (including critical care time)  Medications Ordered in ED Medications - No data to display   Initial Impression / Assessment and Plan / ED Course  I have reviewed the triage vital signs and the nursing notes.  Pertinent labs & imaging results that were available during my care of the patient were reviewed by me and considered in my medical decision making (see chart for details).     Vitals:   08/09/17 1915  BP: (!) 124/94  Pulse: (!) 102  Resp: 18  Temp: 98.4 F (36.9 C)  TempSrc: Oral  SpO2: 100%    Patty Espinoza is 26 y.o. female presenting with URI-like symptoms, patient nontoxic-appearing, saturating well on room air, lung sounds clear to auscultation, afebrile.  Mild tachycardia has resolved on my exam.  Likely viral upper respiratory infection.  Reassured patient, work note provided, encouraged her to push fluids, extensive discussion of return precautions.  Evaluation does not show pathology that would require ongoing emergent intervention or inpatient treatment. Pt is hemodynamically stable and mentating appropriately. Discussed findings and plan with patient/guardian, who agrees with care plan. All questions answered. Return precautions discussed and outpatient follow up given.      Final Clinical Impressions(s) / ED Diagnoses   Final diagnoses:  Viral upper respiratory tract infection    New Prescriptions New Prescriptions   No medications on file     Patty Espinoza 08/09/17 2056    Gwyneth Sprout, MD 08/11/17 [phone]

## 2017-08-19 ENCOUNTER — Encounter: Payer: Self-pay | Admitting: General Practice

## 2017-08-21 ENCOUNTER — Encounter: Payer: Self-pay | Admitting: General Practice

## 2017-09-11 ENCOUNTER — Ambulatory Visit: Payer: Medicaid Other | Admitting: Obstetrics and Gynecology

## 2017-09-12 ENCOUNTER — Telehealth: Payer: Self-pay | Admitting: *Deleted

## 2017-09-12 NOTE — Telephone Encounter (Signed)
Called patient re: no show yesterday for colposcopy. Patient states that she would like to reschedule for sometime next week if possible, the lastest appointment available. [place]. Will route this note to office to get her scheduled.

## 2017-09-29 ENCOUNTER — Ambulatory Visit: Payer: Medicaid Other

## 2017-10-01 NOTE — Telephone Encounter (Signed)
Pt being scheduled for colpo, letter being sent [facility].

## 2017-10-27 ENCOUNTER — Ambulatory Visit: Payer: Medicaid Other | Admitting: Obstetrics & Gynecology

## 2017-11-02 ENCOUNTER — Encounter (HOSPITAL_COMMUNITY): Payer: Self-pay | Admitting: *Deleted

## 2017-11-02 ENCOUNTER — Inpatient Hospital Stay (HOSPITAL_COMMUNITY)
Admission: AD | Admit: 2017-11-02 | Discharge: 2017-11-02 | Disposition: A | Discharge status: Home / Self Care | Payer: Medicaid Other | Source: Ambulatory Visit | Attending: Obstetrics and Gynecology | Admitting: Obstetrics and Gynecology

## 2017-11-02 DIAGNOSIS — Z87891 Personal history of nicotine dependence: Secondary | ICD-10-CM | POA: Insufficient documentation

## 2017-11-02 DIAGNOSIS — N939 Abnormal uterine and vaginal bleeding, unspecified: Secondary | ICD-10-CM | POA: Insufficient documentation

## 2017-11-02 DIAGNOSIS — Z3202 Encounter for pregnancy test, result negative: Secondary | ICD-10-CM | POA: Insufficient documentation

## 2017-11-02 LAB — URINALYSIS, ROUTINE W REFLEX MICROSCOPIC
BACTERIA UA: NONE SEEN
BILIRUBIN URINE: NEGATIVE
Glucose, UA: NEGATIVE mg/dL
KETONES UR: NEGATIVE mg/dL
LEUKOCYTES UA: NEGATIVE
NITRITE: NEGATIVE
PROTEIN: NEGATIVE mg/dL
Specific Gravity, Urine: 1.009 (ref 1.005–1.030)
pH: 6 (ref 5.0–8.0)

## 2017-11-02 LAB — WET PREP, GENITAL
SPERM: NONE SEEN
Trich, Wet Prep: NONE SEEN
Yeast Wet Prep HPF POC: NONE SEEN

## 2017-11-02 LAB — CBC
HCT: 36 % (ref 36.0–46.0)
Hemoglobin: 12.4 g/dL (ref 12.0–15.0)
MCH: 27.9 pg (ref 26.0–34.0)
MCHC: 34.4 g/dL (ref 30.0–36.0)
MCV: 81.1 fL (ref 78.0–100.0)
Platelets: 244 10*3/uL (ref 150–400)
RBC: 4.44 MIL/uL (ref 3.87–5.11)
RDW: 16.5 % — AB (ref 11.5–15.5)
WBC: 9 10*3/uL (ref 4.0–10.5)

## 2017-11-02 LAB — POCT PREGNANCY, URINE: PREG TEST UR: NEGATIVE

## 2017-11-02 MED ORDER — NORGESTIMATE-ETH ESTRADIOL 0.25-35 MG-MCG PO TABS: 1.0000 | ORAL_TABLET | Freq: Every day | ORAL | 10 refills | Status: DC

## 2017-11-02 MED ORDER — NORGESTIMATE-ETH ESTRADIOL 0.25-35 MG-MCG PO TABS: 1.0000 | ORAL_TABLET | Freq: Every day | ORAL | 11 refills | Status: DC

## 2017-11-02 NOTE — Discharge Instructions (Signed)

## 2017-11-02 NOTE — MAU Note (Signed)
Pt presents to mau with complaints of bleeding for over a month spoting and heavy days.  Having more tiredness than usual, feels like she is forcing herself out of bed.  Also noticed migraines, and nerve pains in back.  Just does not feel like her self.  Received depo prevera 07/2017 and has not got another one.  Started on depo because md's suggested for birth control and pregnancy, felt like she had to do so the doctors would shut up about it.

## 2017-11-02 NOTE — MAU Provider Note (Signed)
History     CSN: [id number]  Arrival date and time: 11/02/17 1454   First Provider Initiated Contact with Patient 11/02/17 1533     Chief Complaint  Patient presents with  . Vaginal Bleeding   HPI Patty Espinoza is a 27 y.o. [id number] non pregnant female who presents with vaginal bleeding for 3 weeks. She states some days are heavy like a period and others are just spotting. Denies any pain. She got Depo at her postpartum appointment in October, but does not want to continue it. She reports frequent headaches and feeling fatigued.   OB History    Gravida Para Term Preterm AB Living   7 5 0 5 2 4    SAB TAB Ectopic Multiple Live Births   1 1 0 0 4      Past Medical History:  Diagnosis Date  . Anemia   . GERD (gastroesophageal reflux disease)   . Preterm labor   . Sickle cell trait [facility])     Past Surgical History:  Procedure Laterality Date  . NO PAST SURGERIES      Family History  Problem Relation Age of Onset  . Hypertension Mother   . Diabetes Father   . Hypertension Father   . Diabetes Maternal Grandmother   . Hypertension Maternal Grandmother   . Alcohol abuse Maternal Grandfather   . Arthritis Maternal Grandfather   . COPD Maternal Grandfather   . Diabetes Maternal Grandfather   . Hypertension Maternal Grandfather   . Hyperlipidemia Maternal Grandfather   . Vision loss Maternal Grandfather     Social History   Tobacco Use  . Smoking status: Former Smoker    Packs/day: 0.50    Years: 6.00    Pack years: 3.00    Types: Cigarettes    Last attempt to quit: 01/27/2016    Years since quitting: 1.7  . Smokeless tobacco: Never Used  Substance Use Topics  . Alcohol use: Yes    Comment: +THC on UDS admission, occasional  . Drug use: No    Allergies:  Allergies  Allergen Reactions  . Latex Rash    Medications Prior to Admission  Medication Sig Dispense Refill Last Dose  . medroxyPROGESTERone (DEPO-PROVERA) 150 MG/ML injection Inject 1 mL (150 mg total)  into the muscle every 3 (three) months. (Patient not taking: Reported on 11/02/2017) 1 mL 0 Not Taking at Unknown time    Review of Systems  Constitutional: Positive for fatigue. Negative for fever.  HENT: Negative.   Respiratory: Negative.  Negative for shortness of breath.   Cardiovascular: Negative.  Negative for chest pain.  Gastrointestinal: Negative.  Negative for abdominal pain, constipation, diarrhea, nausea and vomiting.  Genitourinary: Positive for vaginal bleeding. Negative for dysuria.  Neurological: Positive for headaches. Negative for dizziness.   Physical Exam   Blood pressure 129/72, pulse 67, temperature 98.6 F (37 C), temperature source Oral, resp. rate 18, height 5\' 3"  (1.6 m), weight 147 lb 12.8 oz (67 kg), last menstrual period 10/10/2017, SpO2 100 %, not currently breastfeeding.  Physical Exam  Nursing note and vitals reviewed. Constitutional: She is oriented to person, place, and time. She appears well-developed and well-nourished. No distress.  HENT:  Head: Normocephalic.  Eyes: Pupils are equal, round, and reactive to light.  Cardiovascular: Normal rate, regular rhythm and normal heart sounds.  Respiratory: Effort normal and breath sounds normal. No respiratory distress.  GI: Soft. Bowel sounds are normal. She exhibits no distension. There is no tenderness.  Genitourinary:  Genitourinary  Comments: Small amount of dark red blood in vault. Pelvic exam: Cervix pink, visually closed, without lesion, vaginal walls and external genitalia normal Bimanual exam: Cervix 0/long/high, firm, anterior, neg CMT, uterus nontender, nonenlarged, adnexa without tenderness, enlargement, or mass   Neurological: She is alert and oriented to person, place, and time.  Skin: Skin is warm and dry.  Psychiatric: She has a normal mood and affect. Her behavior is normal. Judgment and thought content normal.    MAU Course  Procedures Results for orders placed or performed during the  hospital encounter of 11/02/17 (from the past 24 hour(s))  Urinalysis, Routine w reflex microscopic     Status: Abnormal   Collection Time: 11/02/17  3:12 PM  Result Value Ref Range   Color, Urine STRAW (A) YELLOW   APPearance CLEAR CLEAR   Specific Gravity, Urine 1.009 1.005 - 1.030   pH 6.0 5.0 - 8.0   Glucose, UA NEGATIVE NEGATIVE mg/dL   Hgb urine dipstick MODERATE (A) NEGATIVE   Bilirubin Urine NEGATIVE NEGATIVE   Ketones, ur NEGATIVE NEGATIVE mg/dL   Protein, ur NEGATIVE NEGATIVE mg/dL   Nitrite NEGATIVE NEGATIVE   Leukocytes, UA NEGATIVE NEGATIVE   RBC / HPF 0-5 0 - 5 RBC/hpf   WBC, UA 0-5 0 - 5 WBC/hpf   Bacteria, UA NONE SEEN NONE SEEN   Squamous Epithelial / LPF 0-5 (A) NONE SEEN  Pregnancy, urine POC     Status: None   Collection Time: 11/02/17  3:23 PM  Result Value Ref Range   Preg Test, Ur NEGATIVE NEGATIVE  CBC     Status: Abnormal   Collection Time: 11/02/17  3:38 PM  Result Value Ref Range   WBC 9.0 4.0 - 10.5 K/uL   RBC 4.44 3.87 - 5.11 MIL/uL   Hemoglobin 12.4 12.0 - 15.0 g/dL   HCT 40.936.0 81.136.0 - 91.446.0 %   MCV 81.1 78.0 - 100.0 fL   MCH 27.9 26.0 - 34.0 pg   MCHC 34.4 30.0 - 36.0 g/dL   RDW 78.216.5 (H) 95.611.5 - 21.315.5 %   Platelets 244 150 - 400 K/uL  Wet prep, genital     Status: Abnormal   Collection Time: 11/02/17  3:45 PM  Result Value Ref Range   Yeast Wet Prep HPF POC NONE SEEN NONE SEEN   Trich, Wet Prep NONE SEEN NONE SEEN   Clue Cells Wet Prep HPF POC PRESENT (A) NONE SEEN   WBC, Wet Prep HPF POC FEW (A) NONE SEEN   Sperm NONE SEEN    MDM UA, UPT Wet prep- positive clue cells but no symptoms. Gc/chlamydia CBC  Patient with positive ASCUS pap on multiple occasions and poor follow up. Appointment given from yellow book on Wednesday 1/23 for follow up. Patient agrees to keep appointment.   Assessment and Plan   1. Abnormal uterine bleeding    -Discharge home in stable condition -Rx for OCPs sent to patient's pharmacy -Vaginal bleeding  precautions discussed -Patient [facility] -Patient may return to MAU as needed or if her condition were to change or worsen   Rolm BookbinderCaroline M Neill CNM 11/02/2017, 4:18 PM

## 2017-11-03 LAB — GC/CHLAMYDIA PROBE AMP (~~LOC~~) NOT AT ARMC
CHLAMYDIA, DNA PROBE: NEGATIVE
Neisseria Gonorrhea: NEGATIVE

## 2017-11-05 ENCOUNTER — Encounter: Payer: Self-pay | Admitting: Obstetrics & Gynecology

## 2017-11-05 ENCOUNTER — Ambulatory Visit: Payer: Self-pay | Admitting: Obstetrics & Gynecology

## 2017-11-05 NOTE — Progress Notes (Signed)
Patient had ASCUS pap, positive HPV on 07/14/2017.  She is 27 years old, no other abnormal paps.  As per ASCCP guidelines, she will need cotesting in one year. Patient informed of this, no need for colposcopy at this time.  Was scheduled for colposcopy today, this was cancelled. She will return in 07/2018 for cotesting.  Patty CollinsUGONNA  Lucill Mauck, MD, FACOG Obstetrician & Gynecologist, [facility], [facility]

## 2017-12-03 IMAGING — US US MFM OB COMP +14 WKS
1 series · 14 of 28 positions shown · non-contrast
Comparison: none

[Series 1: us mfm ob comp +14 wks · 87 acquisitions, 14 frames shown]
[im 4/87]
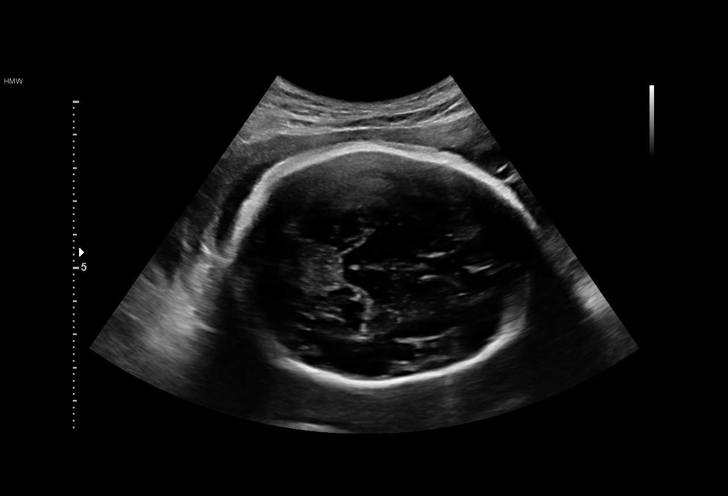
[im 10/87]
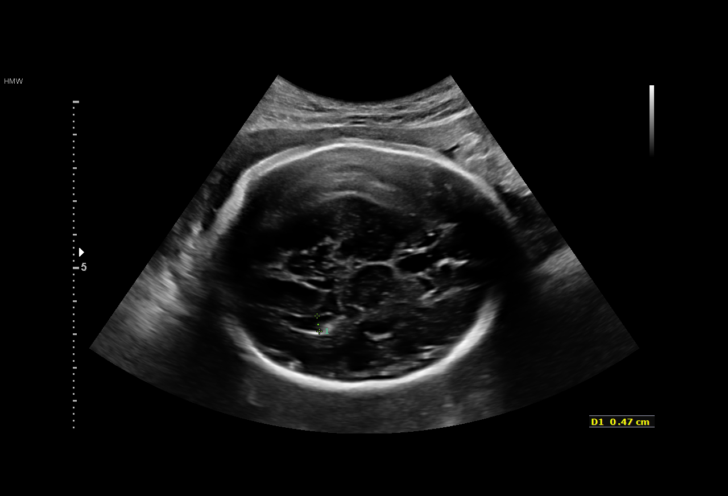
[im 16/87]
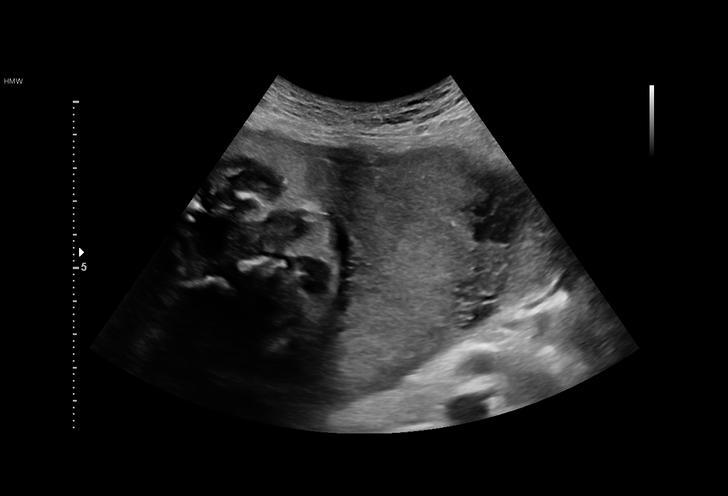
[im 23/87]
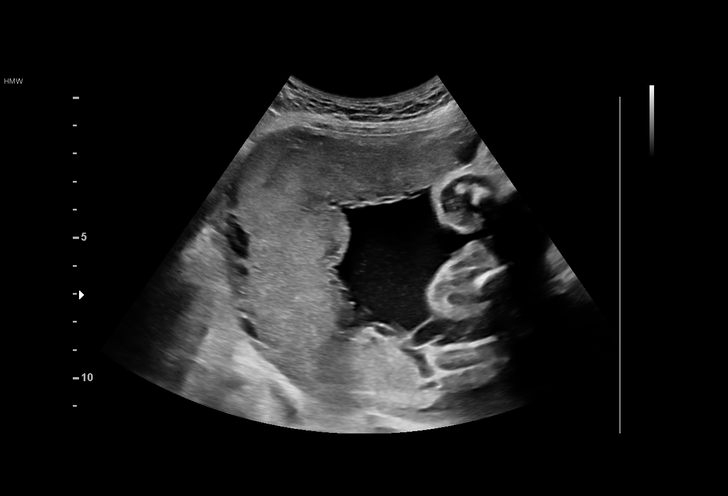
[im 29/87]
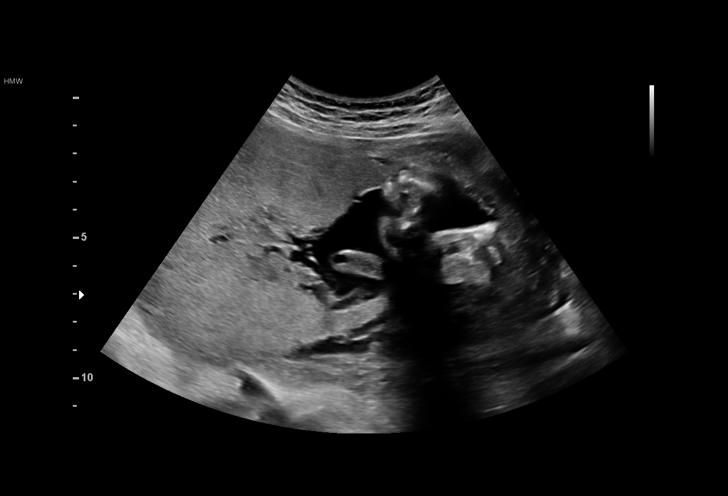
[im 36/87]
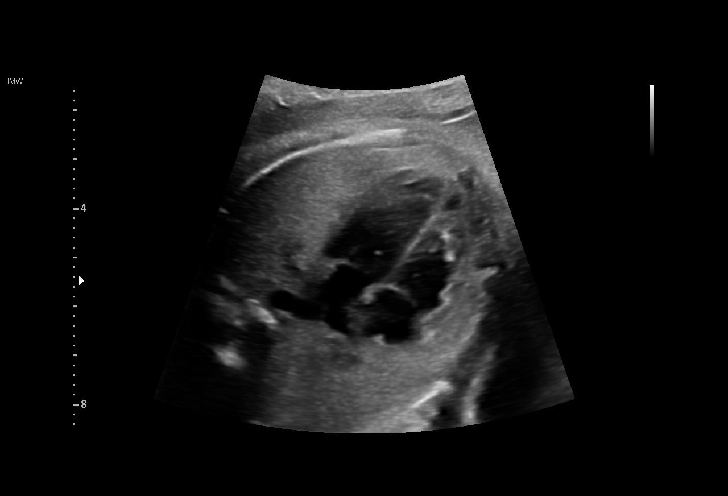
[im 42/87]
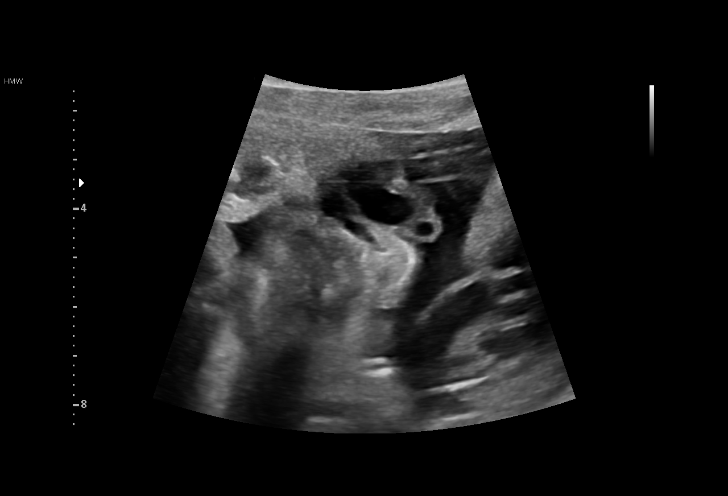
[im 48/87]
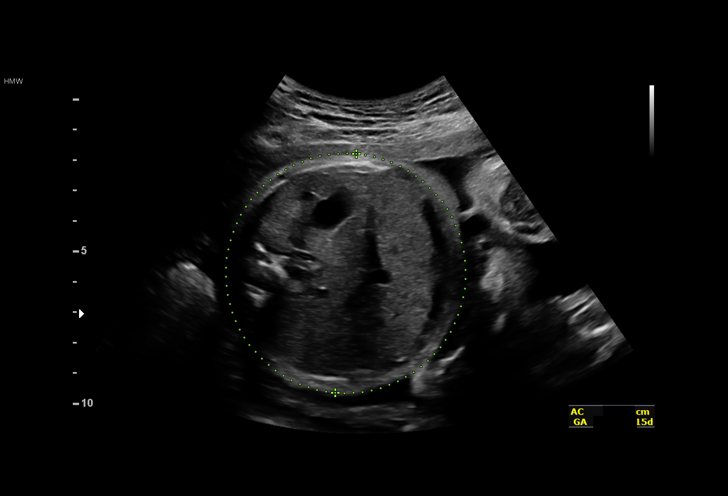
[im 55/87]
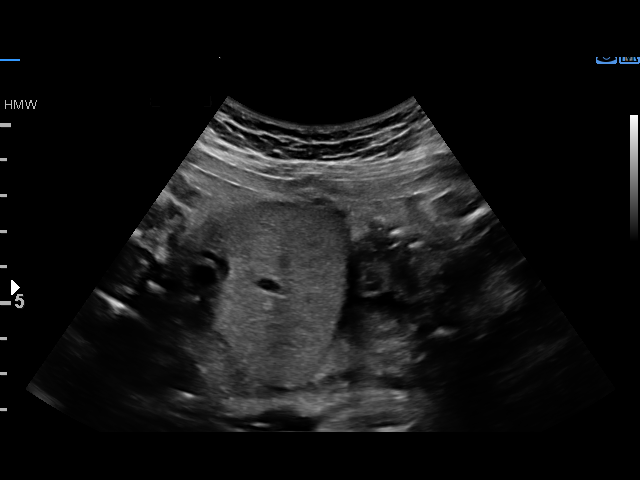
[im 61/87]
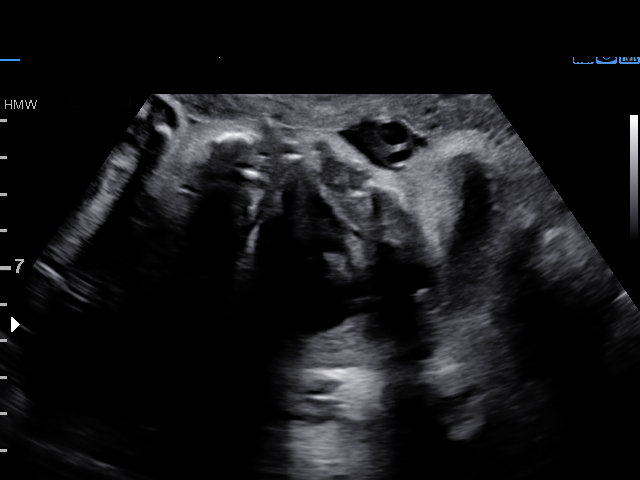
[im 67/87]
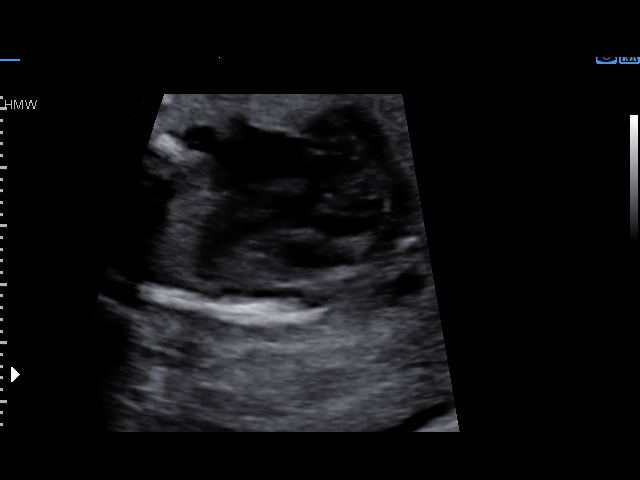
[im 74/87]
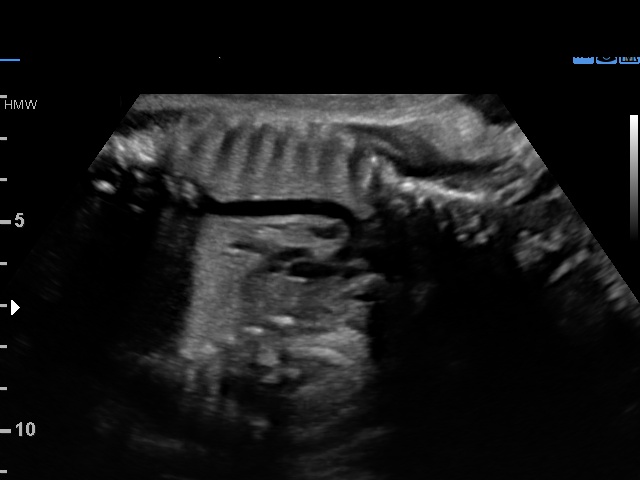
[im 80/87]
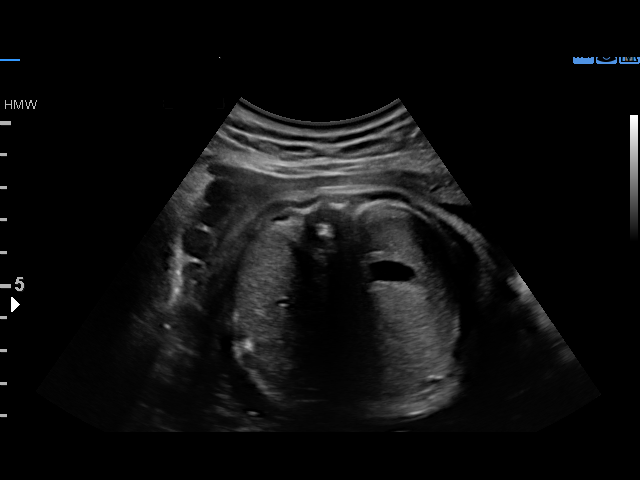
[im 87/87]
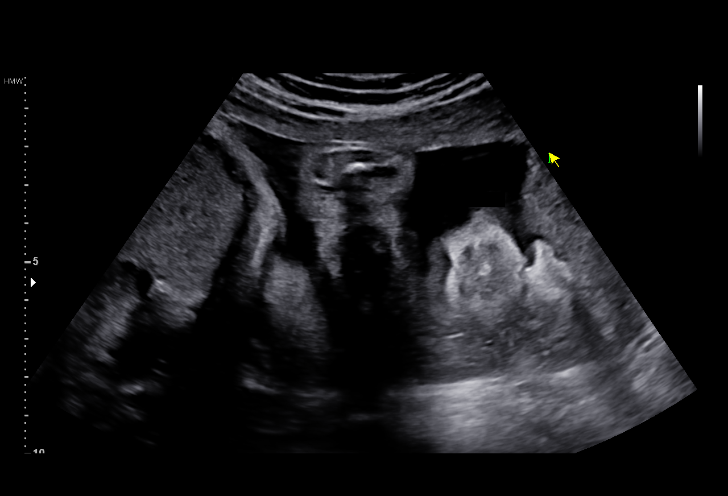

[14 of 28 positions shown; findings below may reference images not displayed]

LOVRENCAA
Antenatal [REDACTED]9

Indications

28 weeks gestation of pregnancy
Poor obstetric history: Previous preterm
delivery, antepartum (X 3)
Late to prenatal care, third trimester
Premature rupture of membranes - leaking
fluid ([DATE]) CDCxJ
Encounter for antenatal screening for
malformations
OB History

Gravidity:    5         Term:   0        Prem:   3        SAB:   1
TOP:          0       Ectopic:  0        Living: 3
Fetal Evaluation

Num Of Fetuses:     1
Fetal Heart         135
Rate(bpm):
Cardiac Activity:   Observed
Presentation:       Cephalic
Placenta:           Fundal, above cervical os
P. Cord Insertion:  Previously Visualized

Amniotic Fluid
AFI FV:      Subjectively low-normal

AFI Sum(cm)     %Tile       Largest Pocket(cm)
10.34           14
RUQ(cm)       RLQ(cm)       LUQ(cm)        LLQ(cm)
2.15
Biometry

BPD:      71.4  mm     G. Age:  28w 5d         50  %    CI:        74.63   %   70 - 86
FL/HC:      19.2   %   18.8 -
HC:      262.3  mm     G. Age:  28w 4d         26  %    HC/AC:      1.06       1.05 -
AC:      248.5  mm     G. Age:  29w 0d         66  %    FL/BPD:     70.6   %   71 - 87
FL:       50.4  mm     G. Age:  27w 0d          9  %    FL/AC:      20.3   %   20 - 24
HUM:      45.5  mm     G. Age:  26w 6d         15  %

Est. FW:    9691  gm    2 lb 11 oz      52  %
Gestational Age

LMP:           34w 5d       Date:   12/14/15                 EDD:   09/19/16
U/S Today:     28w 2d                                        EDD:   11/03/16
Best:          28w 2d    Det. By:   U/S (08/13/16)           EDD:   11/03/16
Anatomy

Cranium:               Appears normal         Aortic Arch:            Appears normal
Cavum:                 Appears normal         Ductal Arch:            Not well visualized
Ventricles:            Appears normal         Diaphragm:              Appears normal
Choroid Plexus:        Appears normal         Stomach:                Appears normal, left
sided
Cerebellum:            Appears normal         Abdomen:                Appears normal
Posterior Fossa:       Appears normal         Abdominal Wall:         Appears nml (cord
insert, abd wall)
Nuchal Fold:           Not applicable (>20    Cord Vessels:           Appears normal (3
wks GA)                                        vessel cord)
Face:                  Profile nl; orbits not Kidneys:                Appear normal
well visualized
Lips:                  Appears normal         Bladder:                Appears normal
Thoracic:              Appears normal         Spine:                  Appears normal
Heart:                 Appears normal         Upper Extremities:      Visualized
(4CH, axis, and situs
RVOT:                  Appears normal         Lower Extremities:      Visualized
LVOT:                  Appears normal

Other:  Technically difficult due to advanced GA and fetal position.
Cervix Uterus Adnexa

Cervix
Not visualized (advanced GA >18wks)

Uterus
No abnormality visualized.

Left Ovary
No adnexal mass visualized.

Right Ovary
No adnexal mass visualized.

Cul De Sac:   No free fluid seen.

Adnexa:       No abnormality visualized.
Impression

SIUP at 28+2 weeks
Cephalic presentation
Normal detailed fetal anatomy; limited views of profile, orbits
and DA
Low normal amniotic fluid volume
EDC based on today's measurements: 10/25/16; EFW at the
52nd %tile
Recommendations

Follow-up ultrasound for growth in 3-4 weeks

## 2018-07-06 ENCOUNTER — Telehealth: Payer: Self-pay | Admitting: General Practice

## 2018-07-06 ENCOUNTER — Other Ambulatory Visit: Payer: Self-pay

## 2018-07-06 DIAGNOSIS — IMO0001 Reserved for inherently not codable concepts without codable children: Secondary | ICD-10-CM

## 2018-07-06 MED ORDER — NORGESTIMATE-ETH ESTRADIOL 0.25-35 MG-MCG PO TABS: 1.0000 | ORAL_TABLET | Freq: Every day | ORAL | 2 refills | Status: DC

## 2018-07-06 NOTE — Telephone Encounter (Signed)
Patient called and left message on nurse voicemail line stating she lost her pills and needs a refill. Patient states the pharmacy told her to contact us for authorization. Called patient and she states she found her pills but she will need a refill because she doesn't have any left. Discussed refills sent to pharmacy but she is due for an annual exam. Told patient she can get further refills at annual. Patient verbalized understanding & asked about pap results from last year because she cannot remember what was it about it. Explained abnormal pap results with patient. Discussed given her age, the body is usually able to heal those abnormal areas within a year so further testing after the abnormal pap smear isn't warranted but it is advised she follow up for a repeat pap in 1 year compared to the usual 3 years. Patient verbalized understanding and will return to office for follow up. Patient had no other questions.

## 2018-07-15 ENCOUNTER — Other Ambulatory Visit: Payer: Self-pay | Admitting: *Deleted

## 2018-07-15 ENCOUNTER — Encounter: Payer: Self-pay | Admitting: *Deleted

## 2018-08-03 ENCOUNTER — Ambulatory Visit: Payer: Self-pay | Admitting: Obstetrics & Gynecology

## 2018-12-19 ENCOUNTER — Ambulatory Visit
Admission: EM | Admit: 2018-12-19 | Discharge: 2018-12-19 | Disposition: A | Discharge status: Home / Self Care | Payer: Self-pay | Attending: Family Medicine | Admitting: Family Medicine

## 2018-12-19 ENCOUNTER — Ambulatory Visit (INDEPENDENT_AMBULATORY_CARE_PROVIDER_SITE_OTHER): Payer: Self-pay

## 2018-12-19 ENCOUNTER — Other Ambulatory Visit: Payer: Self-pay

## 2018-12-19 DIAGNOSIS — S43401A Unspecified sprain of right shoulder joint, initial encounter: Secondary | ICD-10-CM

## 2018-12-19 MED ORDER — IBUPROFEN 800 MG PO TABS: 800.0000 mg | ORAL_TABLET | Freq: Three times a day (TID) | ORAL | 0 refills | Status: DC
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
## 2018-12-19 NOTE — Discharge Instructions (Signed)
No fracture or dislocation Most likely sprain of your shoulder Use sling for comfort, please do not wear all the time and perform shoulder exercises to prevent stiffness Use anti-inflammatories for pain/swelling. You may take up to 800 mg Ibuprofen every 8 hours with food. You may supplement Ibuprofen with Tylenol [phone] mg every 8 hours.  Ice and rest  Follow-up if symptoms not improving or pain worsening

## 2019-01-02 ENCOUNTER — Other Ambulatory Visit: Payer: Self-pay

## 2019-01-02 ENCOUNTER — Encounter: Payer: Self-pay | Admitting: Physician Assistant

## 2019-01-02 ENCOUNTER — Ambulatory Visit
Admission: EM | Admit: 2019-01-02 | Discharge: 2019-01-02 | Disposition: A | Discharge status: Home / Self Care | Payer: Self-pay | Attending: Physician Assistant | Admitting: Physician Assistant

## 2019-01-02 DIAGNOSIS — M545 Low back pain, unspecified: Secondary | ICD-10-CM

## 2019-01-02 MED ORDER — METHOCARBAMOL 500 MG PO TABS: 500.0000 mg | ORAL_TABLET | Freq: Two times a day (BID) | ORAL | 0 refills | Status: DC

## 2019-01-02 MED ORDER — MELOXICAM 7.5 MG PO TABS: 7.5000 mg | ORAL_TABLET | Freq: Every day | ORAL | 0 refills | Status: DC

## 2019-01-02 NOTE — Discharge Instructions (Signed)
Start Mobic. Do not take ibuprofen (motrin/advil)/ naproxen (aleve) while on mobic. Ice/heat compresses as needed. This can take up to 3-4 weeks to completely resolve, but you should be feeling better each week. Follow up here or with PCP if symptoms worsen, changes for reevaluation. If experience numbness/tingling of the inner thighs, loss of bladder or bowel control, go to the emergency department for evaluation.  ° °

## 2019-01-02 NOTE — ED Provider Notes (Signed)
[facility]    CSN: [id number] Arrival date & time: 01/02/19  1411     History   Chief Complaint Chief Complaint  Patient presents with  . Back Pain    HPI Patty Espinoza is a 28 y.o. female.   28 year old female comes in for 1 week history of low back pain.  States bilateral back pain that is intermittent, worse with movement and laying down.  Denies obvious injury/trauma, but work requires heavy lifting.  She denies numbness, tingling, saddle anesthesia, loss of bladder or bowel control.  Denies urinary symptoms such as frequency, dysuria, hematuria.  Denies abdominal pain, nausea, vomiting.  She has been taking a pain medication that she does not know the name of, without much relief. Not breastfeeding.      Past Medical History:  Diagnosis Date  . Anemia   . GDM (gestational diabetes mellitus) 05/16/2017   Failed 1hr. Needs 3hr-->failed  . GERD (gastroesophageal reflux disease)   . Preterm labor   . Sickle cell trait [facility])     Patient Active Problem List   Diagnosis Date Noted  . 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 NO PAST SURGERIES      OB History    Gravida  7   Para  5   Term  0   Preterm  5   AB  2   Living  4     SAB  1   TAB  1   Ectopic  0   Multiple  0   Live Births  4            Home Medications    Prior to Admission medications   Medication Sig Start Date End Date Taking? Authorizing Provider  meloxicam (MOBIC) 7.5 MG tablet Take 1 tablet (7.5 mg total) by mouth daily. 01/02/19   Cathie Hoops,  V, PA-C  methocarbamol (ROBAXIN) 500 MG tablet Take 1 tablet (500 mg total) by mouth 2 (two) times daily. 01/02/19   Cathie Hoops,  V, PA-C  norgestimate-ethinyl estradiol (ORTHO-CYCLEN,SPRINTEC,PREVIFEM) 0.25-35 MG-MCG tablet Take 1 tablet by mouth daily. 11/02/17   Rolm Bookbinder, CNM  norgestimate-ethinyl estradiol (ORTHO-CYCLEN,SPRINTEC,PREVIFEM) 0.25-35 MG-MCG  tablet Take 1 tablet by mouth daily. 07/06/18   Tereso Newcomer, MD    Family History Family History  Problem Relation Age of Onset  . Hypertension Mother   . Diabetes Father   . Hypertension Father   . Diabetes Maternal Grandmother   . Hypertension Maternal Grandmother   . Alcohol abuse Maternal Grandfather   . Arthritis Maternal Grandfather   . COPD Maternal Grandfather   . Diabetes Maternal Grandfather   . Hypertension Maternal Grandfather   . Hyperlipidemia Maternal Grandfather   . Vision loss Maternal Grandfather     Social History Social History   Tobacco Use  . Smoking status: Former Smoker    Packs/day: 0.50    Years: 6.00    Pack years: 3.00    Types: Cigarettes    Last attempt to quit: 01/27/2016    Years since quitting: 2.9  . Smokeless tobacco: Never Used  Substance Use Topics  . Alcohol use: Yes    Comment: +THC on UDS admission, occasional  . Drug use: No     Allergies   Latex   Review of Systems Review of Systems  Reason unable to perform ROS: See HPI as above.     Physical Exam  Triage Vital Signs ED Triage Vitals [01/02/19 1422]  Enc Vitals Group     BP 139/68     Pulse Rate 97     Resp 16     Temp 98.5 F (36.9 C)     Temp Source Oral     SpO2 98 %     Weight      Height      Head Circumference      Peak Flow      Pain Score 7     Pain Loc      Pain Edu?      Excl. in GC?    No data found.  Updated Vital Signs BP 139/68 (BP Location: Right Arm)   Pulse 97   Temp 98.5 F (36.9 C) (Oral)   Resp 16   LMP 12/30/2018 (Exact Date)   SpO2 98%   Physical Exam Constitutional:      General: She is not in acute distress.    Appearance: She is well-developed. She is not diaphoretic.  HENT:     Head: Normocephalic and atraumatic.  Eyes:     Conjunctiva/sclera: Conjunctivae normal.     Pupils: Pupils are equal, round, and reactive to light.  Cardiovascular:     Rate and Rhythm: Normal rate and regular rhythm.     Heart  sounds: Normal heart sounds. No murmur. No friction rub. No gallop.   Pulmonary:     Effort: Pulmonary effort is normal. No accessory muscle usage or respiratory distress.     Breath sounds: Normal breath sounds. No stridor. No decreased breath sounds, wheezing, rhonchi or rales.  Musculoskeletal:     Comments: Diffuse tenderness to palpation of the lumbar region.  No tenderness to palpation of the hip.  Decreased flexion of the back due to pain.  Patient able to lay back, but with significant pain.  Unable to do full active range of motion of hips due to pain.  Full passive range of motion.  Strength deferred.  Sensation intact and equal bilaterally.  Negative straight leg raise.   Skin:    General: Skin is warm and dry.  Neurological:     Mental Status: She is alert and oriented to person, place, and time.    UC Treatments / Results  Labs (all labs ordered are listed, but only abnormal results are displayed) Labs Reviewed - No data to display  EKG None  Radiology No results found.  Procedures Procedures (including critical care time)  Medications Ordered in UC Medications - No data to display  Initial Impression / Assessment and Plan / UC Course  I have reviewed the triage vital signs and the nursing notes.  Pertinent labs & imaging results that were available during my care of the patient were reviewed by me and considered in my medical decision making (see chart for details).    Offered Toradol injection in office today, patient deferred at this time. Start NSAID as directed for pain and inflammation. Muscle relaxant as needed. Ice/heat compresses. Discussed with patient strain can take up to 3-4 weeks to resolve, but should be getting better each week. Return precautions given.   Final Clinical Impressions(s) / UC Diagnoses   Final diagnoses:  Acute bilateral low back pain without sciatica    ED Prescriptions    Medication Sig Dispense Auth. Provider   meloxicam  (MOBIC) 7.5 MG tablet Take 1 tablet (7.5 mg total) by mouth daily. 15 tablet Cathie Hoops,  V, PA-C   methocarbamol (ROBAXIN)  500 MG tablet Take 1 tablet (500 mg total) by mouth 2 (two) times daily. 20 tablet Threasa Alpha,  V, PA-C        ,  V, [place] 01/02/19 1458

## 2019-01-02 NOTE — ED Triage Notes (Signed)
Per pt she has been having lower back pain for over l week no blood in urine no burning when urinating. No abdominal pain. Lifts a lot at work and is thinking more injury due to work.
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
## 2020-05-02 ENCOUNTER — Other Ambulatory Visit: Payer: Self-pay

## 2020-05-02 ENCOUNTER — Ambulatory Visit (HOSPITAL_COMMUNITY)
Admission: EM | Admit: 2020-05-02 | Discharge: 2020-05-02 | Disposition: A | Discharge status: Home / Self Care | Payer: Self-pay | Attending: Family Medicine | Admitting: Family Medicine

## 2020-05-02 DIAGNOSIS — N939 Abnormal uterine and vaginal bleeding, unspecified: Secondary | ICD-10-CM | POA: Insufficient documentation

## 2020-05-02 DIAGNOSIS — Z3202 Encounter for pregnancy test, result negative: Secondary | ICD-10-CM

## 2020-05-02 DIAGNOSIS — N938 Other specified abnormal uterine and vaginal bleeding: Secondary | ICD-10-CM

## 2020-05-02 LAB — POCT URINALYSIS DIP (DEVICE)
Bilirubin Urine: NEGATIVE
Glucose, UA: NEGATIVE mg/dL
Ketones, ur: NEGATIVE mg/dL
Leukocytes,Ua: NEGATIVE
Nitrite: NEGATIVE
Protein, ur: NEGATIVE mg/dL
Specific Gravity, Urine: 1.01 (ref 1.005–1.030)
Urobilinogen, UA: 0.2 mg/dL (ref 0.0–1.0)
pH: 5.5 (ref 5.0–8.0)

## 2020-05-02 LAB — CBC
HCT: 39.5 % (ref 36.0–46.0)
Hemoglobin: 13.4 g/dL (ref 12.0–15.0)
MCH: 29.3 pg (ref 26.0–34.0)
MCHC: 33.9 g/dL (ref 30.0–36.0)
MCV: 86.4 fL (ref 80.0–100.0)
Platelets: 232 10*3/uL (ref 150–400)
RBC: 4.57 MIL/uL (ref 3.87–5.11)
RDW: 15.4 % (ref 11.5–15.5)
WBC: 7.1 10*3/uL (ref 4.0–10.5)
nRBC: 0 % (ref 0.0–0.2)

## 2020-05-02 LAB — POC URINE PREG, ED: Preg Test, Ur: NEGATIVE

## 2020-05-02 MED ORDER — MEGESTROL ACETATE 40 MG PO TABS: 40.0000 mg | ORAL_TABLET | Freq: Two times a day (BID) | ORAL | 0 refills | Status: DC

## 2020-05-02 NOTE — ED Triage Notes (Signed)
Pt c/o intermittent abnormal vaginal bleeding since May and pt reports blood varies in color/heaviness to "bright/light pink to dark red" and mild intermittent abdominal pain.  Denies fever, chills, vag discharge/odor, rash. Pt reports she began Depo Provera injections in January.

## 2020-05-02 NOTE — Discharge Instructions (Addendum)
[facility] [address] [place], [place] [address] [phone]  Vaginal swab pending to screen for any infection contributing to abnormal bleeding Checking hemoglobin to check for anemia, be sure to incorporate iron in diet- I will call if abnormal May begin megace twice daily until bleeding stops Follow up with OBGYN for further evaluation and treatment of bleeding

## 2020-05-02 NOTE — ED Provider Notes (Signed)
[facility]    CSN: [id number] Arrival date & time: 05/02/20  7672      History   Chief Complaint Chief Complaint  Patient presents with  . Vaginal Bleeding    HPI Patty Espinoza is a 29 y.o. female history of anemia, presenting today for evaluation of abnormal bleeding.  Patient reports that after receiving her last dose of Depo in May she has had intermittent bleeding for the past couple months.  Heaviness will wax and wane, color will wax and wane from dark to bright red and light red.  She will has associated cramping with this, feels like menstrual cramping.  Reports after her last birth in 2018 had 3 to 4 months of bleeding.  Tried oral contraceptives but was not using consistently.  She has not followed up with OB/GYN since she does not have insurance currently.  She denies abnormal discharge.  Denies any new sexual partners.  Has had some mild nausea.  On chart review does appear to have prior ASCUS on Pap smear which has not been followed up.  She also expresses concern over possible HPV is reports that her most recent partner does have genital warts.  HPI  Past Medical History:  Diagnosis Date  . Anemia   . GDM (gestational diabetes mellitus) 05/16/2017   Failed 1hr. Needs 3hr-->failed  . GERD (gastroesophageal reflux disease)   . Preterm labor   . Sickle cell trait [facility])     Patient Active Problem List   Diagnosis Date Noted  . 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 NO PAST SURGERIES      OB History    Gravida  7   Para  5   Term  0   Preterm  5   AB  2   Living  4     SAB  1   TAB  1   Ectopic  0   Multiple  0   Live Births  4            Home Medications    Prior to Admission medications   Medication Sig Start Date End Date Taking? Authorizing Provider  megestrol (MEGACE) 40 MG tablet Take 1 tablet (40 mg total) by mouth 2 (two) times daily. 05/02/20    Keyoni Lapinski C, PA-C  meloxicam (MOBIC) 7.5 MG tablet Take 1 tablet (7.5 mg total) by mouth daily. 01/02/19   Cathie Hoops, Amy V, PA-C  methocarbamol (ROBAXIN) 500 MG tablet Take 1 tablet (500 mg total) by mouth 2 (two) times daily. 01/02/19   Cathie Hoops, Amy V, PA-C  norgestimate-ethinyl estradiol (ORTHO-CYCLEN,SPRINTEC,PREVIFEM) 0.25-35 MG-MCG tablet Take 1 tablet by mouth daily. 07/06/18 05/02/20  Tereso Newcomer, MD    Family History Family History  Problem Relation Age of Onset  . Hypertension Mother   . Diabetes Father   . Hypertension Father   . Diabetes Maternal Grandmother   . Hypertension Maternal Grandmother   . Alcohol abuse Maternal Grandfather   . Arthritis Maternal Grandfather   . COPD Maternal Grandfather   . Diabetes Maternal Grandfather   . Hypertension Maternal Grandfather   . Hyperlipidemia Maternal Grandfather   . Vision loss Maternal Grandfather     Social History Social History   Tobacco Use  . Smoking status: Former Smoker    Packs/day: 0.50    Years: 6.00    Pack years: 3.00    Types: Cigarettes  Quit date: 01/27/2016    Years since quitting: 4.2  . Smokeless tobacco: Never Used  Substance Use Topics  . Alcohol use: Yes    Comment: +THC on UDS admission, occasional  . Drug use: No     Allergies   Latex   Review of Systems Review of Systems  Constitutional: Negative for fever.  Respiratory: Negative for shortness of breath.   Cardiovascular: Negative for chest pain.  Gastrointestinal: Positive for abdominal pain and nausea. Negative for diarrhea and vomiting.  Genitourinary: Positive for vaginal bleeding. Negative for dysuria, flank pain, genital sores, hematuria, menstrual problem, vaginal discharge and vaginal pain.  Musculoskeletal: Negative for back pain.  Skin: Negative for rash.  Neurological: Negative for dizziness, light-headedness and headaches.     Physical Exam Triage Vital Signs ED Triage Vitals  Enc Vitals Group     BP 05/02/20 1025  (!) 130/95     Pulse Rate 05/02/20 1025 74     Resp 05/02/20 1025 18     Temp 05/02/20 1025 98.6 F (37 C)     Temp Source 05/02/20 1025 Oral     SpO2 05/02/20 1025 98 %     Weight --      Height --      Head Circumference --      Peak Flow --      Pain Score 05/02/20 1023 0     Pain Loc --      Pain Edu? --      Excl. in GC? --    No data found.  Updated Vital Signs BP (!) 130/95 (BP Location: Right Arm)   Pulse 74   Temp 98.6 F (37 C) (Oral)   Resp 18   SpO2 98%   Visual Acuity Right Eye Distance:   Left Eye Distance:   Bilateral Distance:    Right Eye Near:   Left Eye Near:    Bilateral Near:     Physical Exam Vitals and nursing note reviewed.  Constitutional:      Appearance: She is well-developed.     Comments: No acute distress  HENT:     Head: Normocephalic and atraumatic.     Nose: Nose normal.  Eyes:     Conjunctiva/sclera: Conjunctivae normal.  Cardiovascular:     Rate and Rhythm: Normal rate.  Pulmonary:     Effort: Pulmonary effort is normal. No respiratory distress.  Abdominal:     General: There is no distension.     Comments: Soft, nondistended, nontender light deep palpation throughout abdomen  Genitourinary:    Comments: Normal external female genitalia, no rashes or lesions noted, vaginal mucosa pink, moderate amount of bright red blood present, blood noted exiting os, cervix pink without any lesions Musculoskeletal:        General: Normal range of motion.     Cervical back: Neck supple.  Skin:    General: Skin is warm and dry.  Neurological:     Mental Status: She is alert and oriented to person, place, and time.      UC Treatments / Results  Labs (all labs ordered are listed, but only abnormal results are displayed) Labs Reviewed  POCT URINALYSIS DIP (DEVICE) - Abnormal; Notable for the following components:      Result Value   Hgb urine dipstick SMALL (*)    All other components within normal limits  CBC  POC URINE PREG,  ED  CERVICOVAGINAL ANCILLARY ONLY    EKG   Radiology No results  found.  Procedures Procedures (including critical care time)  Medications Ordered in UC Medications - No data to display  Initial Impression / Assessment and Plan / UC Course  I have reviewed the triage vital signs and the nursing notes.  Pertinent labs & imaging results that were available during my care of the patient were reviewed by me and considered in my medical decision making (see chart for details).     Pregnancy test negative, UA unremarkable, small hemoglobin.  Checking CBC to ensure hemoglobin stable.  Vaginal swab pending to screen for any infection contributing to abnormal bleeding.  Recommended patient to follow-up with OB/GYN to have Pap smear updated and further evaluation and treatment of abnormal bleeding.  Provided Megace given bleeding for months, advised bleeding may return after completing course.  Discussed strict return precautions. Patient verbalized understanding and is agreeable with plan.  Final Clinical Impressions(s) / UC Diagnoses   Final diagnoses:  Abnormal vaginal bleeding     Discharge Instructions     [facility] [address] [place], [place] [address] [phone]  Vaginal swab pending to screen for any infection contributing to abnormal bleeding Checking hemoglobin to check for anemia, be sure to incorporate iron in diet- I will call if abnormal May begin megace twice daily until bleeding stops Follow up with OBGYN for further evaluation and treatment of bleeding    ED Prescriptions    Medication Sig Dispense Auth. Provider   megestrol (MEGACE) 40 MG tablet Take 1 tablet (40 mg total) by mouth 2 (two) times daily. 20 tablet Mellie Buccellato, [place] C, PA-C     PDMP not reviewed this encounter.   Lew Dawes, [place] 05/02/20 1118

## 2020-05-03 LAB — CERVICOVAGINAL ANCILLARY ONLY
Bacterial Vaginitis (gardnerella): POSITIVE — AB
Candida Glabrata: NEGATIVE
Candida Vaginitis: NEGATIVE
Chlamydia: NEGATIVE
Comment: NEGATIVE
Comment: NEGATIVE
Comment: NEGATIVE
Comment: NEGATIVE
Comment: NEGATIVE
Comment: NORMAL
Neisseria Gonorrhea: NEGATIVE
Trichomonas: NEGATIVE

## 2020-05-16 ENCOUNTER — Other Ambulatory Visit: Payer: Self-pay

## 2020-05-16 ENCOUNTER — Encounter (HOSPITAL_COMMUNITY): Payer: Self-pay | Admitting: Emergency Medicine

## 2020-05-16 ENCOUNTER — Ambulatory Visit (HOSPITAL_COMMUNITY)
Admission: EM | Admit: 2020-05-16 | Discharge: 2020-05-16 | Disposition: A | Discharge status: Home / Self Care | Payer: Self-pay | Attending: Family Medicine | Admitting: Family Medicine

## 2020-05-16 DIAGNOSIS — N939 Abnormal uterine and vaginal bleeding, unspecified: Secondary | ICD-10-CM

## 2020-05-16 MED ORDER — MEGESTROL ACETATE 40 MG PO TABS: 40.0000 mg | ORAL_TABLET | Freq: Every day | ORAL | 0 refills | Status: AC

## 2020-05-16 MED ORDER — MEGESTROL ACETATE 40 MG PO TABS: 40.0000 mg | ORAL_TABLET | Freq: Every day | ORAL | 0 refills | Status: DC

## 2020-05-16 MED ORDER — METRONIDAZOLE 500 MG PO TABS
500.0000 mg | ORAL_TABLET | Freq: Two times a day (BID) | ORAL | 0 refills | Status: AC
Start: 2020-05-16 — End: 2020-05-23

## 2020-05-16 MED ORDER — METRONIDAZOLE 500 MG PO TABS: 500.0000 mg | ORAL_TABLET | Freq: Two times a day (BID) | ORAL | 0 refills | Status: DC

## 2020-05-16 NOTE — ED Provider Notes (Addendum)
[facility]    CSN: [id number] Arrival date & time: 05/16/20  1925      History   Chief Complaint Chief Complaint  Patient presents with  . Vaginal Bleeding  . Medication Refill    HPI Patty Espinoza is a 29 y.o. female history of GERD, presenting today for follow-up of abnormal vaginal bleeding.  Patient was seen here on 7/20 for evaluation of abnormal bleeding for months since May.  Symptoms began after receiving her second Depo injection.  She had negative STD screening but was positive for BV which she did not receive medicine for.  She does report continued urinary frequency, denies discharge odor or irritation.  She was given Megace which did stop the bleeding within a couple of days, but has finished the course and reports increased cramping and feeling as if the bleeding is going to restart again.  She has set up OB/GYN follow-up but this is not until 9/20.    HPI  Past Medical History:  Diagnosis Date  . Anemia   . GDM (gestational diabetes mellitus) 05/16/2017   Failed 1hr. Needs 3hr-->failed  . GERD (gastroesophageal reflux disease)   . Preterm labor   . Sickle cell trait [facility])     Patient Active Problem List   Diagnosis Date Noted  . 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 NO PAST SURGERIES      OB History    Gravida  7   Para  5   Term  0   Preterm  5   AB  2   Living  4     SAB  1   TAB  1   Ectopic  0   Multiple  0   Live Births  4            Home Medications    Prior to Admission medications   Medication Sig Start Date End Date Taking? Authorizing Provider  megestrol (MEGACE) 40 MG tablet Take 1 tablet (40 mg total) by mouth daily. 05/16/20   Cybele Maule C, PA-C  metroNIDAZOLE (FLAGYL) 500 MG tablet Take 1 tablet (500 mg total) by mouth 2 (two) times daily for 7 days. 05/16/20 05/23/20  Haylynn Pha C, PA-C  norgestimate-ethinyl estradiol  (ORTHO-CYCLEN,SPRINTEC,PREVIFEM) 0.25-35 MG-MCG tablet Take 1 tablet by mouth daily. 07/06/18 05/02/20  Tereso Newcomer, MD    Family History Family History  Problem Relation Age of Onset  . Hypertension Mother   . Diabetes Father   . Hypertension Father   . Diabetes Maternal Grandmother   . Hypertension Maternal Grandmother   . Alcohol abuse Maternal Grandfather   . Arthritis Maternal Grandfather   . COPD Maternal Grandfather   . Diabetes Maternal Grandfather   . Hypertension Maternal Grandfather   . Hyperlipidemia Maternal Grandfather   . Vision loss Maternal Grandfather     Social History Social History   Tobacco Use  . Smoking status: Former Smoker    Packs/day: 0.50    Years: 6.00    Pack years: 3.00    Types: Cigarettes    Quit date: 01/27/2016    Years since quitting: 4.3  . Smokeless tobacco: Never Used  Substance Use Topics  . Alcohol use: Yes    Comment: +THC on UDS admission, occasional  . Drug use: No     Allergies   Latex   Review of Systems Review of Systems  Constitutional: Negative for  fever.  Respiratory: Negative for shortness of breath.   Cardiovascular: Negative for chest pain.  Gastrointestinal: Negative for abdominal pain, diarrhea, nausea and vomiting.  Genitourinary: Positive for menstrual problem and vaginal bleeding. Negative for dysuria, flank pain, genital sores, hematuria, vaginal discharge and vaginal pain.  Musculoskeletal: Negative for back pain.  Skin: Negative for rash.  Neurological: Negative for dizziness, light-headedness and headaches.     Physical Exam Triage Vital Signs ED Triage Vitals  Enc Vitals Group     BP      Pulse      Resp      Temp      Temp src      SpO2      Weight      Height      Head Circumference      Peak Flow      Pain Score      Pain Loc      Pain Edu?      Excl. in GC?    No data found.  Updated Vital Signs BP (!) 143/77 (BP Location: Left Arm)   Pulse 69   Temp 98.3 F (36.8 C)  (Oral)   Resp 16   SpO2 100%   Visual Acuity Right Eye Distance:   Left Eye Distance:   Bilateral Distance:    Right Eye Near:   Left Eye Near:    Bilateral Near:     Physical Exam Vitals and nursing note reviewed.  Constitutional:      Appearance: She is well-developed.     Comments: No acute distress  HENT:     Head: Normocephalic and atraumatic.     Nose: Nose normal.  Eyes:     Conjunctiva/sclera: Conjunctivae normal.  Cardiovascular:     Rate and Rhythm: Normal rate.  Pulmonary:     Effort: Pulmonary effort is normal. No respiratory distress.  Abdominal:     General: There is no distension.  Musculoskeletal:        General: Normal range of motion.     Cervical back: Neck supple.  Skin:    General: Skin is warm and dry.  Neurological:     Mental Status: She is alert and oriented to person, place, and time.      UC Treatments / Results  Labs (all labs ordered are listed, but only abnormal results are displayed) Labs Reviewed - No data to display  EKG   Radiology No results found.  Procedures Procedures (including critical care time)  Medications Ordered in UC Medications - No data to display  Initial Impression / Assessment and Plan / UC Course  I have reviewed the triage vital signs and the nursing notes.  Pertinent labs & imaging results that were available during my care of the patient were reviewed by me and considered in my medical decision making (see chart for details).     1.  Abnormal bleeding-advised patient to continue to follow-up with OB/GYN, discussed concerns around continued use of Megace.  Will decrease to once daily and provide for patient to use daily to help keep bleeding from returning until following up with OB/GYN.  2.  Bacterial vaginosis-sending in metronidazole as this was untreated from last visit.  Discussed strict return precautions. Patient verbalized understanding and is agreeable with plan.  Final Clinical  Impressions(s) / UC Diagnoses   Final diagnoses:  Abnormal uterine bleeding (AUB)     Discharge Instructions     Please begin metronidazole/Flagyl twice daily for 1  week to treat bacterial vaginosis from prior visit.  Do not drink alcohol until 24 hours after last tablet  Please follow-up with OB/GYN for further evaluation of abnormal bleeding May continue Megace daily until following up with OB/GYN to help with bleeding  Follow up if developing any pain, fevers, swelling, dizziness, lightheadedness    ED Prescriptions    Medication Sig Dispense Auth. Provider   metroNIDAZOLE (FLAGYL) 500 MG tablet  (Status: Discontinued) Take 1 tablet (500 mg total) by mouth 2 (two) times daily for 7 days. 14 tablet Gabriela Giannelli C, PA-C   megestrol (MEGACE) 40 MG tablet  (Status: Discontinued) Take 1 tablet (40 mg total) by mouth daily. 30 tablet Larz Mark C, PA-C   megestrol (MEGACE) 40 MG tablet Take 1 tablet (40 mg total) by mouth daily. 30 tablet Valon Glasscock C, PA-C   metroNIDAZOLE (FLAGYL) 500 MG tablet Take 1 tablet (500 mg total) by mouth 2 (two) times daily for 7 days. 14 tablet Sallyanne Birkhead, [place] C, PA-C     PDMP not reviewed this encounter.   Undine Nealis, [place] C, PA-C 05/16/20 2048    Lew Dawes, PA-C 05/16/20 2049

## 2020-05-16 NOTE — Discharge Instructions (Signed)
Please begin metronidazole/Flagyl twice daily for 1 week to treat bacterial vaginosis from prior visit.  Do not drink alcohol until 24 hours after last tablet  Please follow-up with OB/GYN for further evaluation of abnormal bleeding May continue Megace daily until following up with OB/GYN to help with bleeding  Follow up if developing any pain, fevers, swelling, dizziness, lightheadedness

## 2020-05-16 NOTE — ED Triage Notes (Signed)
Seen  by provider only
